# Patient Record
Sex: Female | Born: 1985 | Race: Black or African American | Hispanic: No | Marital: Single | Smoking: Never smoker
Health system: Southern US, Community
[De-identification: ages and names within clinical notes are randomized; demographics above are authoritative.]

## PROBLEM LIST (undated history)

## (undated) ENCOUNTER — Inpatient Hospital Stay (HOSPITAL_COMMUNITY): Payer: Self-pay

## (undated) DIAGNOSIS — E282 Polycystic ovarian syndrome: Secondary | ICD-10-CM

## (undated) DIAGNOSIS — E349 Endocrine disorder, unspecified: Secondary | ICD-10-CM

## (undated) DIAGNOSIS — N760 Acute vaginitis: Secondary | ICD-10-CM

## (undated) DIAGNOSIS — B9689 Other specified bacterial agents as the cause of diseases classified elsewhere: Secondary | ICD-10-CM

## (undated) DIAGNOSIS — G43909 Migraine, unspecified, not intractable, without status migrainosus: Secondary | ICD-10-CM

## (undated) DIAGNOSIS — N912 Amenorrhea, unspecified: Secondary | ICD-10-CM

## (undated) DIAGNOSIS — N911 Secondary amenorrhea: Secondary | ICD-10-CM

## (undated) HISTORY — DX: Polycystic ovarian syndrome: E28.2

## (undated) HISTORY — PX: NASAL SINUS SURGERY: SHX719

---

## 2013-09-17 ENCOUNTER — Encounter: Payer: Self-pay | Admitting: *Deleted

## 2013-10-03 ENCOUNTER — Ambulatory Visit (INDEPENDENT_AMBULATORY_CARE_PROVIDER_SITE_OTHER): Payer: BC Managed Care – PPO | Admitting: Obstetrics & Gynecology

## 2013-10-03 ENCOUNTER — Encounter: Payer: Self-pay | Admitting: Obstetrics & Gynecology

## 2013-10-03 VITALS — BP 112/72 | HR 71 | Temp 97.2°F | Ht 73.0 in | Wt 199.9 lb

## 2013-10-03 DIAGNOSIS — E282 Polycystic ovarian syndrome: Secondary | ICD-10-CM

## 2013-10-03 MED ORDER — SPIRONOLACTONE 100 MG PO TABS: 100.0000 mg | ORAL_TABLET | Freq: Every day | ORAL | Status: DC

## 2013-10-03 MED ORDER — EFLORNITHINE HCL 13.9 % EX CREA: TOPICAL_CREAM | CUTANEOUS | Status: DC

## 2013-10-03 NOTE — Progress Notes (Signed)
   Subjective:    Patient ID: Lori Vaughn, female    DOB: 09/09/1985, 28 y.o.   MRN: [id number]  HPI  28 yo S G0 here as a referral from the health dept for the issues of: PCOS, and h/o irregular (skipping) periods. She started OCPs again 2 months ago. She would like an u/s. She would also like to try [place]. Her mother used this in the past. She is getting [facility] [place] [facility] but did get some dark pigmentation from the laser. She shaves every few days.  Review of Systems She just had a colpo yesterday at the health dept.     Objective:   Physical Exam        Assessment & Plan:  PCOS- schedule u/s hirsuitism- vanaqa + spironolactone 100 mg daily. Will probably increase to BID after next visit Fasting labs at next visit

## 2013-10-12 ENCOUNTER — Ambulatory Visit (HOSPITAL_COMMUNITY): Payer: BC Managed Care – PPO

## 2013-10-17 ENCOUNTER — Ambulatory Visit (HOSPITAL_COMMUNITY)
Admission: RE | Admit: 2013-10-17 | Discharge: 2013-10-17 | Disposition: A | Discharge status: Home / Self Care | Payer: BC Managed Care – PPO | Source: Ambulatory Visit | Attending: Obstetrics & Gynecology | Admitting: Obstetrics & Gynecology

## 2013-10-17 DIAGNOSIS — N854 Malposition of uterus: Secondary | ICD-10-CM | POA: Insufficient documentation

## 2013-10-17 DIAGNOSIS — E282 Polycystic ovarian syndrome: Secondary | ICD-10-CM

## 2013-11-28 ENCOUNTER — Ambulatory Visit: Payer: BC Managed Care – PPO | Admitting: Obstetrics & Gynecology

## 2014-07-10 ENCOUNTER — Ambulatory Visit (INDEPENDENT_AMBULATORY_CARE_PROVIDER_SITE_OTHER): Payer: BC Managed Care – PPO | Admitting: Family Medicine

## 2014-07-10 VITALS — BP 110/70 | HR 86 | Temp 99.1°F | Resp 16 | Ht 71.0 in | Wt 195.0 lb

## 2014-07-10 DIAGNOSIS — R197 Diarrhea, unspecified: Secondary | ICD-10-CM

## 2014-07-10 DIAGNOSIS — R109 Unspecified abdominal pain: Secondary | ICD-10-CM

## 2014-07-10 DIAGNOSIS — R110 Nausea: Secondary | ICD-10-CM

## 2014-07-10 LAB — POCT CBC
Granulocyte percent: 77.4 %G (ref 37–80)
HCT, POC: 43.7 % (ref 37.7–47.9)
Hemoglobin: 14.3 g/dL (ref 12.2–16.2)
Lymph, poc: 1.1 (ref 0.6–3.4)
MCH: 28.9 pg (ref 27–31.2)
MCHC: 32.7 g/dL (ref 31.8–35.4)
MCV: 88.3 fL (ref 80–97)
MID (cbc): 0.5 (ref 0–0.9)
MPV: 7.3 fL (ref 0–99.8)
POC Granulocyte: 5.7 (ref 2–6.9)
POC LYMPH %: 15.2 % (ref 10–50)
POC MID %: 7.4 % (ref 0–12)
Platelet Count, POC: 205 10*3/uL (ref 142–424)
RBC: 4.95 M/uL (ref 4.04–5.48)
RDW, POC: 14.4 %
WBC: 7.3 10*3/uL (ref 4.6–10.2)

## 2014-07-10 LAB — POCT URINALYSIS DIPSTICK
BILIRUBIN UA: NEGATIVE
GLUCOSE UA: NEGATIVE
Ketones, UA: NEGATIVE
LEUKOCYTES UA: NEGATIVE
NITRITE UA: NEGATIVE
PH UA: 5.5
Protein, UA: NEGATIVE
Spec Grav, UA: 1.015
Urobilinogen, UA: 0.2

## 2014-07-10 LAB — POCT UA - MICROSCOPIC ONLY
CASTS, UR, LPF, POC: NEGATIVE
Crystals, Ur, HPF, POC: NEGATIVE
Mucus, UA: NEGATIVE
Yeast, UA: NEGATIVE

## 2014-07-10 NOTE — Patient Instructions (Signed)
Drink plenty of fluids and get enough rest  Take Tylenol if needed for fever  Take some over-the-counter Imodium (loperamide) if needed for diarrhea  Eat very bland foods such as crackers and toast or bran or rice or mashed potatoes. Bananas are a good food when you have diarrhea. Avoid fried and fatty and spicy foods. Gradually advance back to a regular diet.  In the event of more acute abdominal pain go to the emergency room if we are closed.  The most important thing is staying well hydrated so make sure you're taking plenty of liquids. Avoid milk or alcohol.  Return if not improving

## 2014-07-10 NOTE — Progress Notes (Signed)
Subjective: Patient has been having problems of being sick for the last couple days with a gastroenteritis type infection. She has been nauseated and has had diarrhea. Some abdominal pains in the left side. She has not been febrile but she has felt sweaty. She does not have a history of many of these infections. She has not had any dysuria or vomiting. She's had some vague abdominal pain.  Objective: No acute distress but she does feel sick enough that she wants to laydown. Her throat is clear. Neck supple. Chest clear process. Heart regular without murmurs. Abdomen has normal bowel sounds. Soft without masses or tenderness.  Assessment: Diarrhea Abdominal pains Sweating episodes  Plan: CBC and urinalysis  Results for orders placed or performed in visit on 07/10/14  POCT CBC  Result Value Ref Range   WBC 7.3 4.6 - 10.2 K/uL   Lymph, poc 1.1 0.6 - 3.4   POC LYMPH PERCENT 15.2 10 - 50 %L   MID (cbc) 0.5 0 - 0.9   POC MID % 7.4 0 - 12 %M   POC Granulocyte 5.7 2 - 6.9   Granulocyte percent 77.4 37 - 80 %G   RBC 4.95 4.04 - 5.48 M/uL   Hemoglobin 14.3 12.2 - 16.2 g/dL   HCT, POC 19.143.7 47.837.7 - 47.9 %   MCV 88.3 80 - 97 fL   MCH, POC 28.9 27 - 31.2 pg   MCHC 32.7 31.8 - 35.4 g/dL   RDW, POC 29.514.4 %   Platelet Count, POC 205.0 142 - 424 K/uL   MPV 7.3 0 - 99.8 fL  POCT UA - Microscopic Only  Result Value Ref Range   WBC, Ur, HPF, POC 0-1    RBC, urine, microscopic 1-2    Bacteria, U Microscopic 1+    Mucus, UA Negative    Epithelial cells, urine per micros 0-2    Crystals, Ur, HPF, POC Negative    Casts, Ur, LPF, POC Negative    Yeast, UA Negative   POCT urinalysis dipstick  Result Value Ref Range   Color, UA Amber    Clarity, UA Clear    Glucose, UA Negative    Bilirubin, UA Negative    Ketones, UA Negative    Spec Grav, UA 1.015    Blood, UA Trace-lysed    pH, UA 5.5    Protein, UA Negative    Urobilinogen, UA 0.2    Nitrite, UA Negative    Leukocytes, UA Negative     Assessment: I think this is just a viral gastroenteritis. Treat symptomatically as per instructions. Return as needed.

## 2014-12-10 ENCOUNTER — Ambulatory Visit (INDEPENDENT_AMBULATORY_CARE_PROVIDER_SITE_OTHER): Payer: Self-pay | Admitting: Urgent Care

## 2014-12-10 VITALS — BP 116/68 | HR 76 | Temp 98.2°F | Resp 18 | Ht 71.5 in | Wt 189.0 lb

## 2014-12-10 DIAGNOSIS — R1030 Lower abdominal pain, unspecified: Secondary | ICD-10-CM

## 2014-12-10 DIAGNOSIS — A084 Viral intestinal infection, unspecified: Secondary | ICD-10-CM

## 2014-12-10 DIAGNOSIS — R197 Diarrhea, unspecified: Secondary | ICD-10-CM

## 2014-12-10 DIAGNOSIS — R112 Nausea with vomiting, unspecified: Secondary | ICD-10-CM

## 2014-12-10 LAB — POCT CBC
Granulocyte percent: 60.7 %G (ref 37–80)
HCT, POC: 43.4 % (ref 37.7–47.9)
Hemoglobin: 13.6 g/dL (ref 12.2–16.2)
LYMPH, POC: 1.9 (ref 0.6–3.4)
MCH, POC: 28.4 pg (ref 27–31.2)
MCHC: 31.4 g/dL — AB (ref 31.8–35.4)
MCV: 90.6 fL (ref 80–97)
MID (CBC): 0.3 (ref 0–0.9)
MPV: 6.9 fL (ref 0–99.8)
PLATELET COUNT, POC: 227 10*3/uL (ref 142–424)
POC GRANULOCYTE: 3.5 (ref 2–6.9)
POC LYMPH %: 34 % (ref 10–50)
POC MID %: 5.3 % (ref 0–12)
RBC: 4.79 M/uL (ref 4.04–5.48)
RDW, POC: 16.3 %
WBC: 5.7 10*3/uL (ref 4.6–10.2)

## 2014-12-10 MED ORDER — ONDANSETRON 8 MG PO TBDP: 8.0000 mg | ORAL_TABLET | Freq: Three times a day (TID) | ORAL | Status: DC | PRN

## 2014-12-10 NOTE — Patient Instructions (Signed)

## 2014-12-10 NOTE — Progress Notes (Signed)
MRN: [id number] DOB: 07/28/1986  Subjective:   Lori Vaughn is a 29 y.o. female presenting for chief complaint of Diarrhea; Emesis; Fever; Generalized Body Aches; and Fatigue  Reports 1 day history of watery diarrhea progressed to nausea with vomiting x4, lower abdominal pain, subjective fever, back pain. Abdominal pain was crampy and achy in nature, sometimes felt like a spasm. Patient also states that she did see some small black globs in her vomit. Has done her best to stay hydrated, she did feel some dizziness, heart racing yesterday but this has improved today, she keeps trying to hydrate. Has had 1 sick contact at work, cold/flu like symptoms. Has tried Tylenol with relief of fever and back pain. Of note, patient believes that she is lactose intolerant, has difficulty with dairy products. Denies history of Crohns or UC, no history of colon cancer in her family. Denies eating uncooked meat, drinking unclean water, has not had sick contacts with similar GI symptoms. Denies flank pain, dysuria, hematuria, bloody stool, hematemesis, chest pain, throat pain, oral lesions. Denies headache, sinus congestion, ear pain, ear drainage, shob, wheezing. Patient is sexually active, is not currently on her cycle, would like to hold off on pregnancy test and urinalysis for now. Denies smoking or alcohol use. Denies any other aggravating or relieving factors, no other questions or concerns.  Lori Vaughn currently has no medications in their medication list. She is allergic to peanuts.  Lori Vaughn  has a past medical history of PCOS (polycystic ovarian syndrome). Also  has past surgical history that includes Nasal sinus surgery.  ROS As in subjective.  Objective:   Vitals: BP 116/68 mmHg  Pulse 76  Temp(Src) 98.2 F (36.8 C) (Oral)  Resp 18  Ht 5' 11.5" (1.816 m)  Wt 189 lb (85.73 kg)  BMI 26.00 kg/m2  SpO2 98%  LMP 11/20/2014  Physical Exam  Constitutional: She appears well-developed and  well-nourished.  HENT:  Mouth/Throat: Oropharynx is clear and moist. No oropharyngeal exudate.  Cardiovascular: Normal rate, regular rhythm and intact distal pulses.  Exam reveals no gallop and no friction rub.   No murmur heard. Pulmonary/Chest: No respiratory distress. She has no wheezes. She has no rales. She exhibits no tenderness.  Abdominal: Soft. Bowel sounds are normal. She exhibits no distension and no mass. There is tenderness (lower abdominal). There is no rebound and no guarding.  Skin: Skin is warm and dry. No rash noted. No erythema. No pallor.   Results for orders placed or performed in visit on 12/10/14 (from the past 24 hour(s))  POCT CBC     Status: Abnormal   Collection Time: 12/10/14  5:01 PM  Result Value Ref Range   WBC 5.7 4.6 - 10.2 K/uL   Lymph, poc 1.9 0.6 - 3.4   POC LYMPH PERCENT 34.0 10 - 50 %L   MID (cbc) 0.3 0 - 0.9   POC MID % 5.3 0 - 12 %M   POC Granulocyte 3.5 2 - 6.9   Granulocyte percent 60.7 37 - 80 %G   RBC 4.79 4.04 - 5.48 M/uL   Hemoglobin 13.6 12.2 - 16.2 g/dL   HCT, POC 40.943.4 81.137.7 - 47.9 %   MCV 90.6 80 - 97 fL   MCH, POC 28.4 27 - 31.2 pg   MCHC 31.4 (A) 31.8 - 35.4 g/dL   RDW, POC 91.416.3 %   Platelet Count, POC 227 142 - 424 K/uL   MPV 6.9 0 - 99.8 fL   Assessment and Plan :  1. Watery diarrhea 2. Lower abdominal pain 3. Non-intractable vomiting with nausea, vomiting of unspecified type 4. Viral gastroenteritis - Patient declined UPT, UA. Would like to proceed with conservative management for viral gastroenteritis. Anticipatory guidance provided. Return to clinic if symptoms fail to resolve after 5 days.  Wallis Bamberg, PA-C [facility] [phone] 12/10/2014 4:20 PM

## 2015-02-17 ENCOUNTER — Emergency Department (HOSPITAL_COMMUNITY)
Admission: EM | Admit: 2015-02-17 | Discharge: 2015-02-17 | Disposition: A | Discharge status: Home / Self Care | Payer: Self-pay | Attending: Emergency Medicine | Admitting: Emergency Medicine

## 2015-02-17 DIAGNOSIS — Z8639 Personal history of other endocrine, nutritional and metabolic disease: Secondary | ICD-10-CM | POA: Insufficient documentation

## 2015-02-17 DIAGNOSIS — H9201 Otalgia, right ear: Secondary | ICD-10-CM | POA: Insufficient documentation

## 2015-02-17 NOTE — ED Provider Notes (Signed)
CSN: [id number]     Arrival date & time 02/17/15  1640 History  This chart was scribed for non-physician practitioner, Teressa LowerVrinda Aalia Greulich, NP, working with Elwin MochaBlair Walden, MD by Charline BillsEssence Howell, ED Scribe. This patient was seen in room WTR8/WTR8 and the patient's care was started at 4:49 PM.   Chief Complaint  Patient presents with  . Hearing Loss   The history is provided by the patient. No language interpreter was used.   HPI Comments: Lori Vaughn is a 29 y.o. female who presents to the Emergency Department complaining of hearing loss on the right since yesterday. Pt suspects that an insect flew into her right ear yesterday but she is not certain. She reports gradually improving swelling to her ear canal since yesterday. She has tried flushing her ear out with mineral oil without relief. She denies fever and cold-like symptoms. No injury or h/o cerumen impactions.   Past Medical History  Diagnosis Date  . PCOS (polycystic ovarian syndrome)    Past Surgical History  Procedure Laterality Date  . Nasal sinus surgery     Family History  Problem Relation Age of Onset  . Diabetes Father   . Hypertension Father   . Diabetes Maternal Grandmother   . Diabetes Paternal Grandmother    History  Substance Use Topics  . Smoking status: Never Smoker   . Smokeless tobacco: Never Used  . Alcohol Use: No   OB History    Gravida Para Term Preterm AB TAB SAB Ectopic Multiple Living   0 0 0 0 0 0 0 0 0 0      Review of Systems  Constitutional: Negative for fever.  HENT: Positive for ear pain and hearing loss.   Respiratory: Negative for cough.   All other systems reviewed and are negative.  Allergies  Peanuts  Home Medications   Prior to Admission medications   Medication Sig Start Date End Date Taking? Authorizing Provider  ondansetron (ZOFRAN-ODT) 8 MG disintegrating tablet Take 1 tablet (8 mg total) by mouth every 8 (eight) hours as needed for nausea. 12/10/14   Wallis BambergMario Mani, PA-C    BP 114/63 mmHg  Pulse 72  Temp(Src) 98 F (36.7 C) (Oral)  Resp 16  SpO2 100%  LMP 01/08/2015 (Approximate) Physical Exam  Constitutional: She is oriented to person, place, and time. She appears well-developed and well-nourished. No distress.  HENT:  Head: Normocephalic and atraumatic.  Right Ear: External ear normal.  Left Ear: External ear normal.  Eyes: Conjunctivae and EOM are normal.  Neck: Neck supple. No tracheal deviation present.  Cardiovascular: Normal rate.   Pulmonary/Chest: Effort normal. No respiratory distress.  Musculoskeletal: Normal range of motion.  Neurological: She is alert and oriented to person, place, and time.  Skin: Skin is warm and dry.  Psychiatric: She has a normal mood and affect. Her behavior is normal.  Nursing note and vitals reviewed.  ED Course  Procedures (including critical care time) DIAGNOSTIC STUDIES: Oxygen Saturation is 100% on RA, normal by my interpretation.    COORDINATION OF CARE:  Labs Review Labs Reviewed - No data to display  Imaging Review No results found.   EKG Interpretation None      MDM   Final diagnoses:  Otalgia, right    No infection or fb noted. Likely pt got stung and she had a localized reaction  I personally performed the services described in this documentation, which was scribed in my presence. The recorded information has been reviewed and is accurate.  Teressa Lower, NP 02/17/15 1724  Elwin Mocha, MD 02/17/15 2328

## 2015-02-17 NOTE — ED Notes (Signed)
Verbalized understanding discharge instructions. In no acute distress.   

## 2015-02-17 NOTE — ED Notes (Signed)
Pt states that since yesterday she has not been able to hear out of her R ear. Tried flushing with mineral oil which helped. Alert and oriented.

## 2015-02-17 NOTE — Discharge Instructions (Signed)
Otalgia  The most common reason for this in children is an infection of the middle ear. Pain from the middle ear is usually caused by a build-up of fluid and pressure behind the eardrum. Pain from an earache can be sharp, dull, or burning. The pain may be temporary or constant. The middle ear is connected to the nasal passages by a short narrow tube called the Eustachian tube. The Eustachian tube allows fluid to drain out of the middle ear, and helps keep the pressure in your ear equalized.  CAUSES   A cold or allergy can block the Eustachian tube with inflammation and the build-up of secretions. This is especially likely in small children, because their Eustachian tube is shorter and more horizontal. When the Eustachian tube closes, the normal flow of fluid from the middle ear is stopped. Fluid can accumulate and cause stuffiness, pain, hearing loss, and an ear infection if germs start growing in this area.  SYMPTOMS   The symptoms of an ear infection may include fever, ear pain, fussiness, increased crying, and irritability. Many children will have temporary and minor hearing loss during and right after an ear infection. Permanent hearing loss is rare, but the risk increases the more infections a child has. Other causes of ear pain include retained water in the outer ear canal from swimming and bathing.  Ear pain in adults is less likely to be from an ear infection. Ear pain may be referred from other locations. Referred pain may be from the joint between your jaw and the skull. It may also come from a tooth problem or problems in the neck. Other causes of ear pain include:   A foreign body in the ear.   Outer ear infection.   Sinus infections.   Impacted ear wax.   Ear injury.   Arthritis of the jaw or TMJ problems.   Middle ear infection.   Tooth infections.   Sore throat with pain to the ears.  DIAGNOSIS   Your caregiver can usually make the diagnosis by examining you. Sometimes other special studies,  including x-rays and lab work may be necessary.  TREATMENT    If antibiotics were prescribed, use them as directed and finish them even if you or your child's symptoms seem to be improved.   Sometimes PE tubes are needed in children. These are little plastic tubes which are put into the eardrum during a simple surgical procedure. They allow fluid to drain easier and allow the pressure in the middle ear to equalize. This helps relieve the ear pain caused by pressure changes.  HOME CARE INSTRUCTIONS    Only take over-the-counter or prescription medicines for pain, discomfort, or fever as directed by your caregiver. DO NOT GIVE CHILDREN ASPIRIN because of the association of Reye's Syndrome in children taking aspirin.   Use a cold pack applied to the outer ear for 15-20 minutes, 03-04 times per day or as needed may reduce pain. Do not apply ice directly to the skin. You may cause frost bite.   Over-the-counter ear drops used as directed may be effective. Your caregiver may sometimes prescribe ear drops.   Resting in an upright position may help reduce pressure in the middle ear and relieve pain.   Ear pain caused by rapidly descending from high altitudes can be relieved by swallowing or chewing gum. Allowing infants to suck on a bottle during airplane travel can help.   Do not smoke in the house or near children. If you are   unable to quit smoking, smoke outside.   Control allergies.  SEEK IMMEDIATE MEDICAL CARE IF:    You or your child are becoming sicker.   Pain or fever relief is not obtained with medicine.   You or your child's symptoms (pain, fever, or irritability) do not improve within 24 to 48 hours or as instructed.   Severe pain suddenly stops hurting. This may indicate a ruptured eardrum.   You or your children develop new problems such as severe headaches, stiff neck, difficulty swallowing, or swelling of the face or around the ear.  Document Released: 03/12/2004 Document Revised: 10/18/2011  Document Reviewed: 07/17/2008  ExitCare Patient Information 2015 [employer]. This information is not intended to replace advice given to you by your health care provider. Make sure you discuss any questions you have with your health care provider.

## 2016-01-30 ENCOUNTER — Ambulatory Visit (INDEPENDENT_AMBULATORY_CARE_PROVIDER_SITE_OTHER): Payer: Self-pay | Admitting: Obstetrics & Gynecology

## 2016-01-30 ENCOUNTER — Encounter: Payer: Self-pay | Admitting: Obstetrics & Gynecology

## 2016-01-30 VITALS — BP 108/73 | HR 62

## 2016-01-30 DIAGNOSIS — E282 Polycystic ovarian syndrome: Secondary | ICD-10-CM

## 2016-01-30 DIAGNOSIS — Z01419 Encounter for gynecological examination (general) (routine) without abnormal findings: Secondary | ICD-10-CM

## 2016-01-30 DIAGNOSIS — Z3202 Encounter for pregnancy test, result negative: Secondary | ICD-10-CM

## 2016-01-30 DIAGNOSIS — Z3041 Encounter for surveillance of contraceptive pills: Secondary | ICD-10-CM

## 2016-01-30 LAB — CBC
HCT: 40.1 % (ref 35.0–45.0)
Hemoglobin: 13.6 g/dL (ref 11.7–15.5)
MCH: 30.2 pg (ref 27.0–33.0)
MCHC: 33.9 g/dL (ref 32.0–36.0)
MCV: 89.1 fL (ref 80.0–100.0)
MPV: 10.1 fL (ref 7.5–12.5)
Platelets: 211 10*3/uL (ref 140–400)
RBC: 4.5 MIL/uL (ref 3.80–5.10)
RDW: 13.7 % (ref 11.0–15.0)
WBC: 4.1 10*3/uL (ref 3.8–10.8)

## 2016-01-30 LAB — TSH: TSH: 0.94 mIU/L

## 2016-01-30 LAB — COMPREHENSIVE METABOLIC PANEL
ALT: 17 U/L (ref 6–29)
AST: 16 U/L (ref 10–30)
Albumin: 4.2 g/dL (ref 3.6–5.1)
Alkaline Phosphatase: 53 U/L (ref 33–115)
BILIRUBIN TOTAL: 1.1 mg/dL (ref 0.2–1.2)
BUN: 12 mg/dL (ref 7–25)
CALCIUM: 8.9 mg/dL (ref 8.6–10.2)
CO2: 23 mmol/L (ref 20–31)
CREATININE: 1.1 mg/dL (ref 0.50–1.10)
Chloride: 105 mmol/L (ref 98–110)
GLUCOSE: 85 mg/dL (ref 65–99)
Potassium: 4.5 mmol/L (ref 3.5–5.3)
SODIUM: 139 mmol/L (ref 135–146)
Total Protein: 7.4 g/dL (ref 6.1–8.1)

## 2016-01-30 LAB — POCT PREGNANCY, URINE: Preg Test, Ur: NEGATIVE

## 2016-01-30 MED ORDER — SPIRONOLACTONE 50 MG PO TABS: 50.0000 mg | ORAL_TABLET | Freq: Two times a day (BID) | ORAL | Status: DC

## 2016-01-30 MED ORDER — EFLORNITHINE HCL 13.9 % EX CREA: TOPICAL_CREAM | CUTANEOUS | Status: DC

## 2016-01-30 NOTE — Progress Notes (Signed)
   Subjective:    Patient ID: Lori Vaughn, female    DOB: 08/20/1985, 30 y.o.   MRN: [id number]  HPI 30 yo S AAG0 here with continued PCOS. She was seen last 2015 and was prescribed spironolactone and vanaqua. She did Armed forces technical officeronline research and decided not to start those meds.   Review of Systems [facility]    Objective:   Physical Exam  Tall, thin, pleasant BFNAD Breathing, conversing, and ambulating normally Abd- benign Multiple hyperpigmented areas of neck      Assessment & Plan:  PCOS- on OCPs (started last Sunday-not with a period) Check fasting labs Re prescribe spironolactone and vanaqua

## 2016-01-31 LAB — HEPATITIS C ANTIBODY: HCV Ab: NEGATIVE

## 2016-01-31 LAB — RPR

## 2016-01-31 LAB — HIV ANTIBODY (ROUTINE TESTING W REFLEX): HIV 1&2 Ab, 4th Generation: NONREACTIVE

## 2016-01-31 LAB — HEPATITIS B SURFACE ANTIGEN: Hepatitis B Surface Ag: NEGATIVE

## 2016-12-19 ENCOUNTER — Emergency Department (HOSPITAL_COMMUNITY)
Admission: EM | Admit: 2016-12-19 | Discharge: 2016-12-19 | Disposition: A | Discharge status: Home / Self Care | Payer: Self-pay | Attending: Emergency Medicine | Admitting: Emergency Medicine

## 2016-12-19 ENCOUNTER — Encounter (HOSPITAL_COMMUNITY): Payer: Self-pay | Admitting: Emergency Medicine

## 2016-12-19 DIAGNOSIS — Z5321 Procedure and treatment not carried out due to patient leaving prior to being seen by health care provider: Secondary | ICD-10-CM | POA: Insufficient documentation

## 2016-12-19 DIAGNOSIS — G43909 Migraine, unspecified, not intractable, without status migrainosus: Secondary | ICD-10-CM | POA: Insufficient documentation

## 2016-12-19 HISTORY — DX: Migraine, unspecified, not intractable, without status migrainosus: G43.909

## 2016-12-19 NOTE — ED Triage Notes (Addendum)
Pt from home with complaints of intermittent migraine headache in her left temple that she states has been coming and going all day. Pt states she has hx of migraines. Pt states she may be pregnant

## 2017-01-27 ENCOUNTER — Telehealth: Payer: Self-pay | Admitting: Obstetrics & Gynecology

## 2017-01-27 NOTE — Telephone Encounter (Signed)
Patient called to say she needed an US because she has PCOS and irregular cycles. She does not know how far along she might be.

## 2017-01-28 ENCOUNTER — Encounter (HOSPITAL_COMMUNITY): Payer: Self-pay | Admitting: *Deleted

## 2017-01-28 ENCOUNTER — Inpatient Hospital Stay (HOSPITAL_COMMUNITY)
Admission: AD | Admit: 2017-01-28 | Discharge: 2017-01-28 | Disposition: A | Discharge status: Home / Self Care | Payer: BLUE CROSS/BLUE SHIELD | Source: Ambulatory Visit | Attending: Family Medicine | Admitting: Family Medicine

## 2017-01-28 DIAGNOSIS — N760 Acute vaginitis: Secondary | ICD-10-CM | POA: Diagnosis not present

## 2017-01-28 DIAGNOSIS — E282 Polycystic ovarian syndrome: Secondary | ICD-10-CM | POA: Insufficient documentation

## 2017-01-28 DIAGNOSIS — Z8249 Family history of ischemic heart disease and other diseases of the circulatory system: Secondary | ICD-10-CM | POA: Insufficient documentation

## 2017-01-28 DIAGNOSIS — N912 Amenorrhea, unspecified: Secondary | ICD-10-CM

## 2017-01-28 DIAGNOSIS — R109 Unspecified abdominal pain: Secondary | ICD-10-CM | POA: Diagnosis present

## 2017-01-28 DIAGNOSIS — Z833 Family history of diabetes mellitus: Secondary | ICD-10-CM | POA: Diagnosis not present

## 2017-01-28 DIAGNOSIS — O9989 Other specified diseases and conditions complicating pregnancy, childbirth and the puerperium: Secondary | ICD-10-CM

## 2017-01-28 DIAGNOSIS — Z3200 Encounter for pregnancy test, result unknown: Secondary | ICD-10-CM | POA: Diagnosis not present

## 2017-01-28 DIAGNOSIS — R7989 Other specified abnormal findings of blood chemistry: Secondary | ICD-10-CM

## 2017-01-28 DIAGNOSIS — N911 Secondary amenorrhea: Secondary | ICD-10-CM

## 2017-01-28 DIAGNOSIS — B9689 Other specified bacterial agents as the cause of diseases classified elsewhere: Secondary | ICD-10-CM

## 2017-01-28 DIAGNOSIS — E349 Endocrine disorder, unspecified: Secondary | ICD-10-CM

## 2017-01-28 HISTORY — DX: Amenorrhea, unspecified: N91.2

## 2017-01-28 HISTORY — DX: Other specified bacterial agents as the cause of diseases classified elsewhere: B96.89

## 2017-01-28 HISTORY — DX: Endocrine disorder, unspecified: E34.9

## 2017-01-28 HISTORY — DX: Secondary amenorrhea: N91.1

## 2017-01-28 HISTORY — DX: Other specified abnormal findings of blood chemistry: R79.89

## 2017-01-28 HISTORY — DX: Acute vaginitis: N76.0

## 2017-01-28 LAB — URINALYSIS, ROUTINE W REFLEX MICROSCOPIC
BILIRUBIN URINE: NEGATIVE
Glucose, UA: NEGATIVE mg/dL
HGB URINE DIPSTICK: NEGATIVE
KETONES UR: NEGATIVE mg/dL
LEUKOCYTES UA: NEGATIVE
Nitrite: NEGATIVE
PROTEIN: NEGATIVE mg/dL
Specific Gravity, Urine: 1.005 (ref 1.005–1.030)
pH: 7 (ref 5.0–8.0)

## 2017-01-28 LAB — HCG, QUANTITATIVE, PREGNANCY: HCG, BETA CHAIN, QUANT, S: 26 m[IU]/mL — AB (ref <5)

## 2017-01-28 LAB — POCT PREGNANCY, URINE: Preg Test, Ur: NEGATIVE

## 2017-01-28 LAB — WET PREP, GENITAL
Sperm: NONE SEEN
Trich, Wet Prep: NONE SEEN
Yeast Wet Prep HPF POC: NONE SEEN

## 2017-01-28 LAB — CBC
HCT: 37.7 % (ref 36.0–46.0)
Hemoglobin: 13.1 g/dL (ref 12.0–15.0)
MCH: 30.3 pg (ref 26.0–34.0)
MCHC: 34.7 g/dL (ref 30.0–36.0)
MCV: 87.3 fL (ref 78.0–100.0)
PLATELETS: 222 10*3/uL (ref 150–400)
RBC: 4.32 MIL/uL (ref 3.87–5.11)
RDW: 13.3 % (ref 11.5–15.5)
WBC: 4.8 10*3/uL (ref 4.0–10.5)

## 2017-01-28 MED ORDER — ACETAMINOPHEN 500 MG PO TABS
1000.0000 mg | ORAL_TABLET | Freq: Once | ORAL | Status: AC
  Administered 2017-01-28: 1000 mg via ORAL
  Filled 2017-01-28: qty 2

## 2017-01-28 MED ORDER — PRENATAL 1 30-0.975-200 MG PO CAPS: 1.0000 | ORAL_CAPSULE | Freq: Every day | ORAL | 12 refills | Status: DC

## 2017-01-28 MED ORDER — PROMETHAZINE HCL 12.5 MG PO TABS: 12.5000 mg | ORAL_TABLET | Freq: Four times a day (QID) | ORAL | 0 refills | Status: DC | PRN

## 2017-01-28 MED ORDER — METRONIDAZOLE 0.75 % VA GEL: 1.0000 | Freq: Every day | VAGINAL | 0 refills | Status: AC

## 2017-01-28 NOTE — MAU Provider Note (Signed)
History     CSN: [id number]  Arrival date and time: 01/28/17 1256   First Provider Initiated Contact with Patient 01/28/17 1530      Chief Complaint  Patient presents with  . Abdominal Pain  . Vaginal Bleeding   HPI  Ms. Kathlynn GrateChelsea Kackley is a 31 yo G0 presenting with complaints of 3 (+) HPTs with "two faint lines", abdominal cramping and spotting. She reports having no period since April 2018.  She desires to be pregnant.  Past Medical History:  Diagnosis Date  . Migraines   . PCOS (polycystic ovarian syndrome)     Past Surgical History:  Procedure Laterality Date  . NASAL SINUS SURGERY      Family History  Problem Relation Age of Onset  . Diabetes Father   . Hypertension Father   . Diabetes Maternal Grandmother   . Diabetes Paternal Grandmother     Social History  Substance Use Topics  . Smoking status: Never Smoker  . Smokeless tobacco: Never Used  . Alcohol use No    Allergies:  Allergies  Allergen Reactions  . Molds & Smuts Other (See Comments)    Nasal polyps    Prescriptions Prior to Admission  Medication Sig Dispense Refill Last Dose  . Eflornithine HCl (VANIQA) 13.9 % cream Apply daily 5 g 12   . Norgestimate-Ethinyl Estradiol Triphasic (TRI-LO-ESTARYLLA) 0.18/0.215/0.25 MG-25 MCG tab Take 1 tablet by mouth daily.   Taking  . ondansetron (ZOFRAN-ODT) 8 MG disintegrating tablet Take 1 tablet (8 mg total) by mouth every 8 (eight) hours as needed for nausea. (Patient not taking: Reported on 01/30/2016) 15 tablet 0 Not Taking  . spironolactone (ALDACTONE) 50 MG tablet Take 1 tablet (50 mg total) by mouth 2 (two) times daily. 60 tablet 12     Review of Systems  Constitutional: Negative.   HENT: Negative.   Eyes: Negative.   Respiratory: Negative.   Cardiovascular: Negative.   Gastrointestinal: Positive for nausea. Negative for vomiting.  Endocrine: Negative.   Genitourinary: Positive for pelvic pain (cramping) and vaginal bleeding (spotting).   Musculoskeletal: Negative.   Allergic/Immunologic: Negative.   Neurological: Negative.   Hematological: Negative.   Psychiatric/Behavioral: Negative.    Physical Exam   Blood pressure 119/77, pulse 60, temperature 97.3 F (36.3 C), temperature source Oral, resp. rate 18, height 6' (1.829 m), weight 74.8 kg (165 lb), last menstrual period 11/07/2016.  Physical Exam  Constitutional: She is oriented to person, place, and time. She appears well-developed and well-nourished.  HENT:  Head: Normocephalic.  Eyes: Pupils are equal, round, and reactive to light.  Neck: Normal range of motion.  Cardiovascular: Normal rate, regular rhythm, normal heart sounds and intact distal pulses.   Respiratory: Effort normal and breath sounds normal.  GI: Soft. Bowel sounds are normal.  Genitourinary:  Genitourinary Comments: Uterus: normal, non-tender, cx; smooth, pink, no lesions, small amt of pinkish - white d/c, closed/long/firm, no CMT or friability, no adnexal tenderness or rebound tenderness    Musculoskeletal: Normal range of motion.  Neurological: She is alert and oriented to person, place, and time. She has normal reflexes.  Skin: Skin is warm and dry.  Psychiatric: She has a normal mood and affect. Her behavior is normal. Judgment and thought content normal.    MAU Course  Procedures  MDM [facility] UPT HCG CBC Results for orders placed or performed during the hospital encounter of 01/28/17 (from the past 24 hour(s))  Urinalysis, Routine w reflex microscopic     Status: Abnormal  Collection Time: 01/28/17  1:20 PM  Result Value Ref Range   Color, Urine STRAW (A) YELLOW   APPearance CLEAR CLEAR   Specific Gravity, Urine 1.005 1.005 - 1.030   pH 7.0 5.0 - 8.0   Glucose, UA NEGATIVE NEGATIVE mg/dL   Hgb urine dipstick NEGATIVE NEGATIVE   Bilirubin Urine NEGATIVE NEGATIVE   Ketones, ur NEGATIVE NEGATIVE mg/dL   Protein, ur NEGATIVE NEGATIVE mg/dL   Nitrite NEGATIVE NEGATIVE    Leukocytes, UA NEGATIVE NEGATIVE  Pregnancy, urine POC     Status: None   Collection Time: 01/28/17  1:49 PM  Result Value Ref Range   Preg Test, Ur NEGATIVE NEGATIVE  CBC     Status: None   Collection Time: 01/28/17  3:18 PM  Result Value Ref Range   WBC 4.8 4.0 - 10.5 K/uL   RBC 4.32 3.87 - 5.11 MIL/uL   Hemoglobin 13.1 12.0 - 15.0 g/dL   HCT 08.6 57.8 - 46.9 %   MCV 87.3 78.0 - 100.0 fL   MCH 30.3 26.0 - 34.0 pg   MCHC 34.7 30.0 - 36.0 g/dL   RDW 62.9 52.8 - 41.3 %   Platelets 222 150 - 400 K/uL  hCG, quantitative, pregnancy     Status: Abnormal   Collection Time: 01/28/17  3:18 PM  Result Value Ref Range   hCG, Beta Chain, Quant, S 26 (H) <5 mIU/mL  Wet prep, genital     Status: Abnormal   Collection Time: 01/28/17  3:51 PM  Result Value Ref Range   Yeast Wet Prep HPF POC NONE SEEN NONE SEEN   Trich, Wet Prep NONE SEEN NONE SEEN   Clue Cells Wet Prep HPF POC PRESENT (A) NONE SEEN   WBC, Wet Prep HPF POC FEW (A) NONE SEEN   Sperm NONE SEEN    Assessment and Plan  Elevated serum hCG - Repeat HCG on Monday 01/31/17 at 11:00 AM at [facility]  - Bleeding precautions discussed and written instructions given - Advised to return to MAU for increased bleeding and/or pain - Ok to take Tylenol for pain - Stay well-hydrated - Discussed the appropriate rise of HCG levels in 48 hrs with normal pregnancies  Bacterial vaginitis - Instructions of BV given - Rx for Metrogel 0.75% pv hs x 5 days  Discharge home Patient verbalized an understanding of the plan of care and agrees.   Raelyn Mora, MSN, CNM 01/28/2017, 3:42 PM

## 2017-01-28 NOTE — MAU Note (Signed)
Pt states she had positive 3 HPT's, started cramping last night, also bleeding - spotting.  Has appt for U/S with mobile unit on Monday, [place] [facility] [place] on Wednesday [facility].

## 2017-01-29 ENCOUNTER — Inpatient Hospital Stay (HOSPITAL_COMMUNITY)
Admission: AD | Admit: 2017-01-29 | Discharge: 2017-01-29 | Disposition: A | Discharge status: Home / Self Care | Payer: BLUE CROSS/BLUE SHIELD | Source: Ambulatory Visit | Attending: Obstetrics & Gynecology | Admitting: Obstetrics & Gynecology

## 2017-01-29 ENCOUNTER — Encounter (HOSPITAL_COMMUNITY): Payer: Self-pay | Admitting: *Deleted

## 2017-01-29 DIAGNOSIS — Z888 Allergy status to other drugs, medicaments and biological substances status: Secondary | ICD-10-CM | POA: Insufficient documentation

## 2017-01-29 DIAGNOSIS — Z9889 Other specified postprocedural states: Secondary | ICD-10-CM | POA: Diagnosis not present

## 2017-01-29 DIAGNOSIS — Z8249 Family history of ischemic heart disease and other diseases of the circulatory system: Secondary | ICD-10-CM | POA: Insufficient documentation

## 2017-01-29 DIAGNOSIS — M545 Low back pain: Secondary | ICD-10-CM | POA: Insufficient documentation

## 2017-01-29 DIAGNOSIS — Z79899 Other long term (current) drug therapy: Secondary | ICD-10-CM | POA: Diagnosis not present

## 2017-01-29 DIAGNOSIS — O039 Complete or unspecified spontaneous abortion without complication: Secondary | ICD-10-CM | POA: Insufficient documentation

## 2017-01-29 DIAGNOSIS — Z833 Family history of diabetes mellitus: Secondary | ICD-10-CM | POA: Diagnosis not present

## 2017-01-29 DIAGNOSIS — E282 Polycystic ovarian syndrome: Secondary | ICD-10-CM | POA: Insufficient documentation

## 2017-01-29 DIAGNOSIS — Z3A11 11 weeks gestation of pregnancy: Secondary | ICD-10-CM | POA: Diagnosis not present

## 2017-01-29 LAB — ABO/RH: ABO/RH(D): B POS

## 2017-01-29 LAB — HCG, QUANTITATIVE, PREGNANCY: hCG, Beta Chain, Quant, S: 17 m[IU]/mL — ABNORMAL HIGH (ref <5)

## 2017-01-29 LAB — HIV ANTIBODY (ROUTINE TESTING W REFLEX): HIV Screen 4th Generation wRfx: NONREACTIVE

## 2017-01-29 MED ORDER — OXYCODONE-ACETAMINOPHEN 5-325 MG PO TABS
1.0000 | ORAL_TABLET | ORAL | 0 refills | Status: DC | PRN
Start: 2017-01-29 — End: 2017-03-16

## 2017-01-29 MED ORDER — IBUPROFEN 600 MG PO TABS: 600.0000 mg | ORAL_TABLET | Freq: Four times a day (QID) | ORAL | 0 refills | Status: DC | PRN

## 2017-01-29 NOTE — MAU Provider Note (Signed)
History     CSN: [id number]  Arrival date and time: 01/29/17 [id number]   None     Chief Complaint  Patient presents with  . Vaginal Bleeding  . Back Pain   HPI   Lori Vaughn is a 31 y.o. female G2P0010 @ 2688w6d here in MAU with bleeding. She was seen yesterday in the MAU and was told to follow up on Monday for further blood work. She presented yesterday with pink vaginal spotting. The bleeding has progressed to bright red menstrual like bleeding. She is having some mild lower back pain that comes and goes.   OB History    Gravida Para Term Preterm AB Living   2 0 0 0 1 0   SAB TAB Ectopic Multiple Live Births   0 1 0 0        Past Medical History:  Diagnosis Date  . Amenorrhea 01/28/2017  . Amenorrhea, secondary 01/28/2017  . Bacterial vaginitis 01/28/2017  . Elevated serum hCG 01/28/2017   HCG 26 on 01/28/17  . Migraines   . PCOS (polycystic ovarian syndrome)     Past Surgical History:  Procedure Laterality Date  . NASAL SINUS SURGERY      Family History  Problem Relation Age of Onset  . Diabetes Father   . Hypertension Father   . Diabetes Maternal Grandmother   . Diabetes Paternal Grandmother     Social History  Substance Use Topics  . Smoking status: Never Smoker  . Smokeless tobacco: Never Used  . Alcohol use No    Allergies:  Allergies  Allergen Reactions  . Molds & Smuts Other (See Comments)    Nasal polyps    Prescriptions Prior to Admission  Medication Sig Dispense Refill Last Dose  . metroNIDAZOLE (METROGEL VAGINAL) 0.75 % vaginal gel Place 1 Applicatorful vaginally at bedtime. 70 g 0   . Prenatal MV-Min-Fe Fum-FA-DHA (PRENATAL 1) 30-0.975-200 MG CAPS Take 1 tablet by mouth daily. 30 capsule 12   . promethazine (PHENERGAN) 12.5 MG tablet Take 1 tablet (12.5 mg total) by mouth every 6 (six) hours as needed for nausea or vomiting. May only take at bedtime 30 tablet 0    Results for orders placed or performed during the hospital encounter of  01/29/17 (from the past 48 hour(s))  hCG, quantitative, pregnancy     Status: Abnormal   Collection Time: 01/29/17  8:16 AM  Result Value Ref Range   hCG, Beta Chain, Quant, S 17 (H) <5 mIU/mL    Comment:          GEST. AGE      CONC.  (mIU/mL)   <=1 WEEK        5 - 50     2 WEEKS       50 - 500     3 WEEKS       100 - 10,000     4 WEEKS     1,000 - 30,000     5 WEEKS     3,500 - 115,000   6-8 WEEKS     12,000 - 270,000    12 WEEKS     15,000 - 220,000        FEMALE AND NON-PREGNANT FEMALE:     LESS THAN 5 mIU/mL   ABO/Rh     Status: None (Preliminary result)   Collection Time: 01/29/17  8:16 AM  Result Value Ref Range   ABO/RH(D) B POS     Review of Systems  Gastrointestinal:  Positive for abdominal pain.  Genitourinary: Positive for vaginal bleeding.  Musculoskeletal: Positive for back pain.   Physical Exam   Blood pressure 118/76, pulse 61, temperature 98.5 F (36.9 C), resp. rate 18, height 6' (1.829 m), weight 165 lb (74.8 kg), last menstrual period 11/07/2016.  Physical Exam  Constitutional: She is oriented to person, place, and time. She appears well-developed and well-nourished. No distress.  HENT:  Head: Normocephalic.  Eyes: Pupils are equal, round, and reactive to light.  Respiratory: Effort normal.  GI: Soft. She exhibits no distension. There is no tenderness. There is no rebound.  Genitourinary:  Genitourinary Comments: Small amount of bright red blood noted on peri pad.   Musculoskeletal: Normal range of motion.  Neurological: She is alert and oriented to person, place, and time.  Skin: Skin is warm. She is not diaphoretic.  Psychiatric: Her behavior is normal.   MAU Course  Procedures  None  MDM  Quant 6/22: 26 Quant 6/23: 17 ABO- B positive blood type.  .   Assessment and Plan   A:  1. SAB (spontaneous abortion)     P:  Discharge home in stable condition Rx: Percocet, ibuprofen.  Return to MAU if symptoms worsen Follow up with the  [facility] as scheduled. Contact info given Support given   Duane Lope, NP 01/29/2017 3:43 PM

## 2017-01-29 NOTE — MAU Note (Signed)
Pt was here yesterday and returns now with increased bleeding. States had pink spotting last night but when awoke this am had brighter blood on tissue but did not need to wear a pad to hospital.

## 2017-01-29 NOTE — Discharge Instructions (Signed)

## 2017-01-31 ENCOUNTER — Ambulatory Visit: Payer: BLUE CROSS/BLUE SHIELD

## 2017-01-31 ENCOUNTER — Telehealth: Payer: Self-pay

## 2017-01-31 LAB — GC/CHLAMYDIA PROBE AMP (~~LOC~~) NOT AT ARMC
CHLAMYDIA, DNA PROBE: NEGATIVE
NEISSERIA GONORRHEA: NEGATIVE

## 2017-01-31 NOTE — Telephone Encounter (Signed)
Patient was down on the schedule today for a BHCG STAT. Patient miss her appointment.Called patient to reschedule her appointment.Appointment has been scheduled for 02/01/2017.

## 2017-02-01 ENCOUNTER — Ambulatory Visit: Payer: BLUE CROSS/BLUE SHIELD

## 2017-02-01 DIAGNOSIS — O3680X0 Pregnancy with inconclusive fetal viability, not applicable or unspecified: Secondary | ICD-10-CM

## 2017-02-01 LAB — HCG, QUANTITATIVE, PREGNANCY: hCG, Beta Chain, Quant, S: 3 m[IU]/mL (ref <5)

## 2017-02-01 NOTE — Progress Notes (Signed)
Patient presented to office today for a stat beta. She reports some bleeding and cramping at this time.  Patient stated she would not be able to stay for her results today. I have given her my direct number to call back in 2 hours. Patient voice understanding at this time.

## 2017-02-01 NOTE — Telephone Encounter (Signed)
Patient came into the office today for a repeat stat quant. We will call her with her results once they come back.

## 2017-03-16 ENCOUNTER — Ambulatory Visit (HOSPITAL_COMMUNITY)
Admission: EM | Admit: 2017-03-16 | Discharge: 2017-03-16 | Disposition: A | Discharge status: Home / Self Care | Payer: BC Managed Care – PPO | Attending: Internal Medicine | Admitting: Internal Medicine

## 2017-03-16 DIAGNOSIS — J329 Chronic sinusitis, unspecified: Secondary | ICD-10-CM

## 2017-03-16 NOTE — ED Notes (Signed)
See downtime documentation 

## 2017-03-16 NOTE — ED Provider Notes (Signed)
[facility]    CSN: [id number] Arrival date & time: 03/16/17  1616     History   Chief Complaint No chief complaint on file.   HPI Lori Vaughn is a 31 y.o. female.   This 31 year old woman who presented with sinus congestion for the last 2 weeks. She's had sinus surgery in the past. She feels that she's been passing tissue from her nose over the last 24 hours. There's been no epistaxis, however.        Past Medical History:  Diagnosis Date  . Amenorrhea 01/28/2017  . Amenorrhea, secondary 01/28/2017  . Bacterial vaginitis 01/28/2017  . Elevated serum hCG 01/28/2017   HCG 26 on 01/28/17  . Migraines   . PCOS (polycystic ovarian syndrome)     Patient Active Problem List   Diagnosis Date Noted  . Amenorrhea 01/28/2017  . Bacterial vaginitis 01/28/2017  . Elevated serum hCG 01/28/2017  . PCOS (polycystic ovarian syndrome) 01/30/2016    Past Surgical History:  Procedure Laterality Date  . NASAL SINUS SURGERY      OB History    Gravida Para Term Preterm AB Living   2 0 0 0 1 0   SAB TAB Ectopic Multiple Live Births   0 1 0 0         Home Medications    Prior to Admission medications   Medication Sig Start Date End Date Taking? Authorizing Provider  ibuprofen (ADVIL,MOTRIN) 600 MG tablet Take 1 tablet (600 mg total) by mouth every 6 (six) hours as needed. 01/29/17   Rasch, Victorino Dike I, NP  Prenatal MV-Min-Fe Fum-FA-DHA (PRENATAL 1) 30-0.975-200 MG CAPS Take 1 tablet by mouth daily. 01/28/17   Raelyn Mora, CNM    Family History Family History  Problem Relation Age of Onset  . Diabetes Father   . Hypertension Father   . Diabetes Maternal Grandmother   . Diabetes Paternal Grandmother     Social History Social History  Substance Use Topics  . Smoking status: Never Smoker  . Smokeless tobacco: Never Used  . Alcohol use No     Allergies   Molds & smuts   Review of Systems Review of Systems  Constitutional: Negative.   HENT:  Positive for congestion.   Respiratory: Negative.   All other systems reviewed and are negative.    Physical Exam Triage Vital Signs ED Triage Vitals  Enc Vitals Group     BP      Pulse      Resp      Temp      Temp src      SpO2      Weight      Height      Head Circumference      Peak Flow      Pain Score      Pain Loc      Pain Edu?      Excl. in GC?    No data found.   Updated Vital Signs LMP 11/07/2016 (Approximate) Comment: unsure of LMP, has PCOS   Physical Exam  Constitutional: She is oriented to person, place, and time. She appears well-developed and well-nourished.  HENT:  Right Ear: External ear normal.  Left Ear: External ear normal.  Nose: Nose normal.  Mouth/Throat: Oropharynx is clear and moist.  Eyes: Pupils are equal, round, and reactive to light. Conjunctivae are normal.  Neck: Normal range of motion.  Cardiovascular: Normal rate and regular rhythm.   Pulmonary/Chest: Effort normal and  breath sounds normal.  Musculoskeletal: Normal range of motion.  Neurological: She is alert and oriented to person, place, and time.  Skin: Skin is warm and dry.  Nursing note and vitals reviewed.    UC Treatments / Results  Labs (all labs ordered are listed, but only abnormal results are displayed) Labs Reviewed - No data to display  EKG  EKG Interpretation None       Radiology No results found.  Procedures Procedures (including critical care time)  Medications Ordered in UC Medications - No data to display   Initial Impression / Assessment and Plan / UC Course  I have reviewed the triage vital signs and the nursing notes.  Pertinent labs & imaging results that were available during my care of the patient were reviewed by me and considered in my medical decision making (see chart for details).     Final Clinical Impressions(s) / UC Diagnoses   Final diagnoses:  Recurrent sinusitis    New Prescriptions Current Discharge Medication  List        Elvina SidleLauenstein, Gaelle Adriance, MD 03/16/17 1925

## 2017-03-24 ENCOUNTER — Encounter (HOSPITAL_COMMUNITY): Payer: Self-pay

## 2017-03-24 ENCOUNTER — Emergency Department (HOSPITAL_COMMUNITY)
Admission: EM | Admit: 2017-03-24 | Discharge: 2017-03-24 | Disposition: A | Discharge status: Home / Self Care | Payer: BC Managed Care – PPO | Attending: Emergency Medicine | Admitting: Emergency Medicine

## 2017-03-24 DIAGNOSIS — R202 Paresthesia of skin: Secondary | ICD-10-CM

## 2017-03-24 DIAGNOSIS — R112 Nausea with vomiting, unspecified: Secondary | ICD-10-CM | POA: Diagnosis not present

## 2017-03-24 DIAGNOSIS — Z79899 Other long term (current) drug therapy: Secondary | ICD-10-CM | POA: Diagnosis not present

## 2017-03-24 DIAGNOSIS — R197 Diarrhea, unspecified: Secondary | ICD-10-CM | POA: Diagnosis not present

## 2017-03-24 LAB — HEPATIC FUNCTION PANEL
ALK PHOS: 49 U/L (ref 38–126)
ALT: 15 U/L (ref 14–54)
AST: 17 U/L (ref 15–41)
Albumin: 3.6 g/dL (ref 3.5–5.0)
Bilirubin, Direct: 0.2 mg/dL (ref 0.1–0.5)
Indirect Bilirubin: 0.7 mg/dL (ref 0.3–0.9)
TOTAL PROTEIN: 6.5 g/dL (ref 6.5–8.1)
Total Bilirubin: 0.9 mg/dL (ref 0.3–1.2)

## 2017-03-24 LAB — BASIC METABOLIC PANEL
ANION GAP: 11 (ref 5–15)
BUN: 12 mg/dL (ref 6–20)
CO2: 23 mmol/L (ref 22–32)
CREATININE: 1.1 mg/dL — AB (ref 0.44–1.00)
Calcium: 8.5 mg/dL — ABNORMAL LOW (ref 8.9–10.3)
Chloride: 104 mmol/L (ref 101–111)
GLUCOSE: 97 mg/dL (ref 65–99)
Potassium: 3.3 mmol/L — ABNORMAL LOW (ref 3.5–5.1)
Sodium: 138 mmol/L (ref 135–145)

## 2017-03-24 LAB — URINALYSIS, ROUTINE W REFLEX MICROSCOPIC
BILIRUBIN URINE: NEGATIVE
Glucose, UA: NEGATIVE mg/dL
KETONES UR: NEGATIVE mg/dL
NITRITE: NEGATIVE
Protein, ur: NEGATIVE mg/dL
SPECIFIC GRAVITY, URINE: 1.013 (ref 1.005–1.030)
pH: 5 (ref 5.0–8.0)

## 2017-03-24 LAB — CBC
HCT: 36.7 % (ref 36.0–46.0)
HEMOGLOBIN: 12.8 g/dL (ref 12.0–15.0)
MCH: 29.7 pg (ref 26.0–34.0)
MCHC: 34.9 g/dL (ref 30.0–36.0)
MCV: 85.2 fL (ref 78.0–100.0)
PLATELETS: 304 10*3/uL (ref 150–400)
RBC: 4.31 MIL/uL (ref 3.87–5.11)
RDW: 12.8 % (ref 11.5–15.5)
WBC: 7.3 10*3/uL (ref 4.0–10.5)

## 2017-03-24 LAB — LIPASE, BLOOD: Lipase: 41 U/L (ref 11–51)

## 2017-03-24 LAB — POC URINE PREG, ED: PREG TEST UR: NEGATIVE

## 2017-03-24 MED ORDER — ONDANSETRON 4 MG PO TBDP: 4.0000 mg | ORAL_TABLET | Freq: Three times a day (TID) | ORAL | 0 refills | Status: DC | PRN

## 2017-03-24 MED ORDER — ONDANSETRON HCL 4 MG/2ML IJ SOLN
4.0000 mg | Freq: Once | INTRAMUSCULAR | Status: AC
  Administered 2017-03-24: 4 mg via INTRAVENOUS
  Filled 2017-03-24: qty 2

## 2017-03-24 MED ORDER — SODIUM CHLORIDE 0.9 % IV BOLUS (SEPSIS)
1000.0000 mL | Freq: Once | INTRAVENOUS | Status: AC
Start: 2017-03-24 — End: 2017-03-24
  Administered 2017-03-24: 1000 mL via INTRAVENOUS

## 2017-03-24 NOTE — ED Triage Notes (Signed)
Pt states she woke up this morning feeling light headed. She also reports nausea with vomiting. Pt states she is currently taking cipro, prednisone, and flagyl for sinus infection and BV. Pt alert and oriented, skin warm and dry. Pt also states "I have a bitter/metallic taste in my mouth."

## 2017-03-24 NOTE — ED Provider Notes (Signed)
[facility] Note   CSN: [id number] Arrival date & time: 03/24/17  4098     History   Chief Complaint Chief Complaint  Patient presents with  . Emesis  . Numbness    HPI Lori Vaughn is a 31 y.o. female.  31yo F w/ PMH including PCOS who p/w vomiting, diarrhea, and lightheadedness. She woke up feeling lightheaded and "weird" this morning. She states she felt confused and with right arm numbness. She reports generalized weakness. She began having nausea and vomiting. She tried to drive to work but came to ED instead because she felt so bad. She reports intermittent headaches but no specific headache this morning. No visual changes. She reports diarrhea that looks "coffee grounds" for the past week since starting antibiotics. No heavy NSAID use, no alcohol use. She started her period this week. No recent travel or sick contacts.    The history is provided by the patient.  Emesis      Past Medical History:  Diagnosis Date  . Amenorrhea 01/28/2017  . Amenorrhea, secondary 01/28/2017  . Bacterial vaginitis 01/28/2017  . Elevated serum hCG 01/28/2017   HCG 26 on 01/28/17  . Migraines   . PCOS (polycystic ovarian syndrome)     Patient Active Problem List   Diagnosis Date Noted  . Amenorrhea 01/28/2017  . Bacterial vaginitis 01/28/2017  . Elevated serum hCG 01/28/2017  . PCOS (polycystic ovarian syndrome) 01/30/2016    Past Surgical History:  Procedure Laterality Date  . NASAL SINUS SURGERY      OB History    Gravida Para Term Preterm AB Living   2 0 0 0 1 0   SAB TAB Ectopic Multiple Live Births   0 1 0 0         Home Medications    Prior to Admission medications   Medication Sig Start Date End Date Taking? Authorizing Provider  ibuprofen (ADVIL,MOTRIN) 600 MG tablet Take 1 tablet (600 mg total) by mouth every 6 (six) hours as needed. Patient not taking: Reported on 03/24/2017 01/29/17   Rasch, Victorino Dike I, NP  ondansetron (ZOFRAN ODT) 4 MG  disintegrating tablet Take 1 tablet (4 mg total) by mouth every 8 (eight) hours as needed for nausea or vomiting. 03/24/17   Ameena Vesey, Ambrose Finland, MD  Prenatal MV-Min-Fe Fum-FA-DHA (PRENATAL 1) 30-0.975-200 MG CAPS Take 1 tablet by mouth daily. Patient not taking: Reported on 03/24/2017 01/28/17   Raelyn Mora, CNM    Family History Family History  Problem Relation Age of Onset  . Diabetes Father   . Hypertension Father   . Diabetes Maternal Grandmother   . Diabetes Paternal Grandmother     Social History Social History  Substance Use Topics  . Smoking status: Never Smoker  . Smokeless tobacco: Never Used  . Alcohol use No     Allergies   Molds & smuts   Review of Systems Review of Systems  Gastrointestinal: Positive for vomiting.  All other systems reviewed and are negative except that which was mentioned in HPI    Physical Exam Updated Vital Signs BP 125/77   Pulse (!) 54   Temp (!) 97.3 F (36.3 C) (Oral)   Resp 16   LMP 03/21/2017 (Within Days) Comment: unsure of LMP, has PCOS  SpO2 100%   Breastfeeding? Unknown   Physical Exam  Constitutional: She is oriented to person, place, and time. She appears well-developed and well-nourished. No distress.  Awake, alert  HENT:  Head: Normocephalic and atraumatic.  Mouth/Throat: Oropharynx is clear and moist.  Eyes: Pupils are equal, round, and reactive to light. Conjunctivae and EOM are normal.  Neck: Neck supple.  Cardiovascular: Normal rate, regular rhythm and normal heart sounds.   No murmur heard. Pulmonary/Chest: Effort normal and breath sounds normal. No respiratory distress.  Abdominal: Soft. Bowel sounds are normal. She exhibits no distension. There is no tenderness.  Musculoskeletal: She exhibits no edema.  Neurological: She is alert and oriented to person, place, and time. She has normal reflexes. No cranial nerve deficit. She exhibits normal muscle tone.  Fluent speech, normal finger-to-nose testing,  negative pronator drift, no clonus 5/5 strength and normal sensation x all 4 extremities  Skin: Skin is warm and dry.  hirsuitism  Psychiatric: She has a normal mood and affect. Judgment and thought content normal.  Nursing note and vitals reviewed.    ED Treatments / Results  Labs (all labs ordered are listed, but only abnormal results are displayed) Labs Reviewed  BASIC METABOLIC PANEL - Abnormal; Notable for the following:       Result Value   Potassium 3.3 (*)    Creatinine, Ser 1.10 (*)    Calcium 8.5 (*)    All other components within normal limits  URINALYSIS, ROUTINE W REFLEX MICROSCOPIC - Abnormal; Notable for the following:    Hgb urine dipstick LARGE (*)    Leukocytes, UA SMALL (*)    Bacteria, UA FEW (*)    Squamous Epithelial / LPF 0-5 (*)    All other components within normal limits  CBC  HEPATIC FUNCTION PANEL  LIPASE, BLOOD  POC URINE PREG, ED    EKG  EKG Interpretation  Date/Time:  Thursday March 24 2017 08:18:56 EDT Ventricular Rate:  61 PR Interval:  130 QRS Duration: 80 QT Interval:  426 QTC Calculation: 428 R Axis:   85 Text Interpretation:  Normal sinus rhythm with sinus arrhythmia Nonspecific T wave abnormality Abnormal ECG Artifact Confirmed by Frederick PeersLittle, Reyan Helle [phone]([id number]) on 03/24/2017 9:18:28 AM       Radiology No results found.  Procedures Procedures (including critical care time)  Medications Ordered in ED Medications  sodium chloride 0.9 % bolus 1,000 mL (0 mLs Intravenous Stopped 03/24/17 1320)  ondansetron (ZOFRAN) injection 4 mg (4 mg Intravenous Given 03/24/17 1035)     Initial Impression / Assessment and Plan / ED Course  I have reviewed the triage vital signs and the nursing notes.  Pertinent labs & imaging results that were available during my care of the patient were reviewed by me and considered in my medical decision making (see chart for details).    PT p/w Lightheadedness and malaise this morning as well as some nausea  and vomiting. She reported some right arm tingling and numbness. On exam she was nontoxic with normal vital signs. She had a normal neurologic exam and denied any extremity weakness, sudden headache, or other concerning symptoms to suggest acute intracranial process or spinal process. Abdomen was soft and nontender. Gave the patient IV fluids and Zofran after which she was able to drink ginger ale and eat crackers. Her labs here are reassuring with no evidence of dehydration or other acute process. She denies any urinary symptoms. Gave Zofran to use as needed at home. She wants to discontinue Flagyl and ciprofloxacin, I discussed risks and benefits of doing this although I suspect she is likely safe to do so as she only has one dose left of each. She voiced understanding of return precautions and was  discharged in satisfactory condition.   Final Clinical Impressions(s) / ED Diagnoses   Final diagnoses:  Paresthesia  Nausea vomiting and diarrhea    New Prescriptions Discharge Medication List as of 03/24/2017  1:20 PM    START taking these medications   Details  ondansetron (ZOFRAN ODT) 4 MG disintegrating tablet Take 1 tablet (4 mg total) by mouth every 8 (eight) hours as needed for nausea or vomiting., Starting Thu 03/24/2017, Print         Aubri Gathright, Ambrose Finland, MD 03/24/17 1757

## 2017-03-24 NOTE — ED Notes (Signed)
Walked patient to the bathroom to get

## 2017-03-24 NOTE — ED Notes (Signed)
Took patient saline lock out patient is resting

## 2017-12-21 ENCOUNTER — Encounter (HOSPITAL_COMMUNITY): Payer: Self-pay | Admitting: *Deleted

## 2017-12-21 ENCOUNTER — Inpatient Hospital Stay (HOSPITAL_COMMUNITY)
Admission: AD | Admit: 2017-12-21 | Discharge: 2017-12-21 | Disposition: A | Discharge status: Home / Self Care | Payer: BC Managed Care – PPO | Source: Ambulatory Visit | Attending: Obstetrics and Gynecology | Admitting: Obstetrics and Gynecology

## 2017-12-21 DIAGNOSIS — N760 Acute vaginitis: Secondary | ICD-10-CM | POA: Insufficient documentation

## 2017-12-21 DIAGNOSIS — R109 Unspecified abdominal pain: Secondary | ICD-10-CM

## 2017-12-21 DIAGNOSIS — N939 Abnormal uterine and vaginal bleeding, unspecified: Secondary | ICD-10-CM | POA: Insufficient documentation

## 2017-12-21 DIAGNOSIS — Z79899 Other long term (current) drug therapy: Secondary | ICD-10-CM | POA: Diagnosis not present

## 2017-12-21 DIAGNOSIS — B9689 Other specified bacterial agents as the cause of diseases classified elsewhere: Secondary | ICD-10-CM | POA: Diagnosis not present

## 2017-12-21 LAB — URINALYSIS, ROUTINE W REFLEX MICROSCOPIC
BILIRUBIN URINE: NEGATIVE
Glucose, UA: NEGATIVE mg/dL
KETONES UR: NEGATIVE mg/dL
Nitrite: NEGATIVE
PH: 5 (ref 5.0–8.0)
PROTEIN: NEGATIVE mg/dL
Specific Gravity, Urine: 1.013 (ref 1.005–1.030)

## 2017-12-21 LAB — WET PREP, GENITAL
Sperm: NONE SEEN
TRICH WET PREP: NONE SEEN
YEAST WET PREP: NONE SEEN

## 2017-12-21 LAB — POCT PREGNANCY, URINE: PREG TEST UR: NEGATIVE

## 2017-12-21 MED ORDER — IBUPROFEN 600 MG PO TABS
600.0000 mg | ORAL_TABLET | Freq: Once | ORAL | Status: AC
  Administered 2017-12-21: 600 mg via ORAL
  Filled 2017-12-21: qty 1

## 2017-12-21 MED ORDER — METRONIDAZOLE 500 MG PO TABS: 500.0000 mg | ORAL_TABLET | Freq: Two times a day (BID) | ORAL | 0 refills | Status: DC

## 2017-12-21 MED ORDER — IBUPROFEN 600 MG PO TABS: 600.0000 mg | ORAL_TABLET | Freq: Four times a day (QID) | ORAL | 0 refills | Status: DC | PRN

## 2017-12-21 MED ORDER — FLUCONAZOLE 150 MG PO TABS: 150.0000 mg | ORAL_TABLET | Freq: Every day | ORAL | 1 refills | Status: DC | PRN

## 2017-12-21 MED ORDER — MEDROXYPROGESTERONE ACETATE 10 MG PO TABS: 10.0000 mg | ORAL_TABLET | Freq: Every day | ORAL | 0 refills | Status: DC

## 2017-12-21 NOTE — Discharge Instructions (Signed)
Abnormal Uterine Bleeding °Abnormal uterine bleeding means bleeding more than usual from your uterus. It can include: °· Bleeding between periods. °· Bleeding after sex. °· Bleeding that is heavier than normal. °· Periods that last longer than usual. °· Bleeding after you have stopped having your period (menopause). °There are many problems that may cause this. You should see a doctor for any kind of bleeding that is not normal. Treatment depends on the cause of the bleeding. °Follow these instructions at home: °· Watch your condition for any changes. °· Do not use tampons, douche, or have sex, if your doctor tells you not to. °· Change your pads often. °· Get regular well-woman exams. Make sure they include a pelvic exam and cervical cancer screening. °· Keep all follow-up visits as told by your doctor. This is important. °Contact a doctor if: °· The bleeding lasts more than one week. °· You feel dizzy at times. °· You feel like you are going to throw up (nauseous). °· You throw up. °Get help right away if: °· You pass out. °· You have to change pads every hour. °· You have belly (abdominal) pain. °· You have a fever. °· You get sweaty. °· You get weak. °· You passing large blood clots from your vagina. °Summary °· Abnormal uterine bleeding means bleeding more than usual from your uterus. °· There are many problems that may cause this. You should see a doctor for any kind of bleeding that is not normal. °· Treatment depends on the cause of the bleeding. °This information is not intended to replace advice given to you by your health care provider. Make sure you discuss any questions you have with your health care provider. °Document Released: 05/23/2009 Document Revised: 07/20/2016 Document Reviewed: 07/20/2016 °Elsevier Interactive Patient Education © 2017 [employer] ° °Bacterial Vaginosis °Bacterial vaginosis is an infection of the vagina. It happens when too many germs (bacteria) grow in the vagina. This  infection puts you at risk for infections from sex (STIs). Treating this infection can lower your risk for some STIs. You should also treat this if you are pregnant. It can cause your baby to be born early. °Follow these instructions at home: °Medicines  °· Take over-the-counter and prescription medicines only as told by your doctor. °· Take or use your antibiotic medicine as told by your doctor. Do not stop taking or using it even if you start to feel better. °General instructions  °· If you your sexual partner is a woman, tell her that you have this infection. She needs to get treatment if she has symptoms. If you have a female partner, he does not need to be treated. °· During treatment: °¨ Avoid sex. °¨ Do not douche. °¨ Avoid alcohol as told. °¨ Avoid breastfeeding as told. °· Drink enough fluid to keep your pee (urine) clear or pale yellow. °· Keep your vagina and butt (rectum) clean. °¨ Wash the area with warm water every day. °¨ Wipe from front to back after you use the toilet. °· Keep all follow-up visits as told by your doctor. This is important. °Preventing this condition  °· Do not douche. °· Use only warm water to wash around your vagina. °· Use protection when you have sex. This includes: °¨ Latex condoms. °¨ Dental dams. °· Limit how many people you have sex with. It is best to only have sex with the same person (be monogamous). °· Get tested for STIs. Have your partner get tested. °· Wear underwear   that is cotton or lined with cotton. °· Avoid tight pants and pantyhose. This is most important in summer. °· Do not use any products that have nicotine or tobacco in them. These include cigarettes and e-cigarettes. If you need help quitting, ask your doctor. °· Do not use illegal drugs. °· Limit how much alcohol you drink. °Contact a doctor if: °· Your symptoms do not get better, even after you are treated. °· You have more discharge or pain when you pee (urinate). °· You have a fever. °· You have pain in  your belly (abdomen). °· You have pain with sex. °· Your bleed from your vagina between periods. °Summary °· This infection happens when too many germs (bacteria) grow in the vagina. °· Treating this condition can lower your risk for some infections from sex (STIs). °· You should also treat this if you are pregnant. It can cause early (premature) birth. °· Do not stop taking or using your antibiotic medicine even if you start to feel better. °This information is not intended to replace advice given to you by your health care provider. Make sure you discuss any questions you have with your health care provider. °Document Released: 05/04/2008 Document Revised: 04/10/2016 Document Reviewed: 04/10/2016 °Elsevier Interactive Patient Education © 2017 [employer] ° °

## 2017-12-21 NOTE — MAU Note (Signed)
Pt reports abnormal bleeding for the last 2 months, spotting followed by a few days of period like bleeding and then spotting again. Cramping off/on

## 2017-12-21 NOTE — MAU Provider Note (Signed)
History     CSN: [id number]  Arrival date and time: 12/21/17 6578   First Provider Initiated Contact with Patient 12/21/17 1939      Chief Complaint  Patient presents with  . Vaginal Bleeding   Ms.Lori Vaughn is a 32 y.o. female G2P0010 non pregnant female here in MAU with abnormal vaginal bleeding.    Vaginal Bleeding  The patient's primary symptoms include vaginal bleeding. This is a recurrent problem. The current episode started more than 1 month ago (2 months ago). The problem occurs every several days. The problem has been unchanged. The pain is mild. The problem affects both sides. She is not pregnant. Associated symptoms include abdominal pain. The vaginal discharge was bloody and scant. The vaginal bleeding is spotting. She has not been passing clots. She has not been passing tissue. She has tried nothing for the symptoms. She is sexually active. She uses nothing for contraception.     OB History    Gravida  2   Para  0   Term  0   Preterm  0   AB  1   Living  0     SAB  0   TAB  1   Ectopic  0   Multiple  0   Live Births              Past Medical History:  Diagnosis Date  . Amenorrhea 01/28/2017  . Amenorrhea, secondary 01/28/2017  . Bacterial vaginitis 01/28/2017  . Elevated serum hCG 01/28/2017   HCG 26 on 01/28/17  . Migraines   . PCOS (polycystic ovarian syndrome)     Past Surgical History:  Procedure Laterality Date  . NASAL SINUS SURGERY      Family History  Problem Relation Age of Onset  . Diabetes Father   . Hypertension Father   . Diabetes Maternal Grandmother   . Diabetes Paternal Grandmother     Social History   Tobacco Use  . Smoking status: Never Smoker  . Smokeless tobacco: Never Used  Substance Use Topics  . Alcohol use: No  . Drug use: No    Allergies:  Allergies  Allergen Reactions  . Molds & Smuts Other (See Comments)    Nasal polyps    Medications Prior to Admission  Medication Sig Dispense Refill  Last Dose  . ibuprofen (ADVIL,MOTRIN) 600 MG tablet Take 1 tablet (600 mg total) by mouth every 6 (six) hours as needed. (Patient not taking: Reported on 03/24/2017) 30 tablet 0 Not Taking at Unknown time  . ondansetron (ZOFRAN ODT) 4 MG disintegrating tablet Take 1 tablet (4 mg total) by mouth every 8 (eight) hours as needed for nausea or vomiting. 6 tablet 0   . Prenatal MV-Min-Fe Fum-FA-DHA (PRENATAL 1) 30-0.975-200 MG CAPS Take 1 tablet by mouth daily. (Patient not taking: Reported on 03/24/2017) 30 capsule 12 Not Taking at Unknown time   Results for orders placed or performed during the hospital encounter of 12/21/17 (from the past 48 hour(s))  GC/Chlamydia probe amp ([place])not at [facility]     Status: None   Collection Time: 12/21/17 12:00 AM  Result Value Ref Range   Chlamydia Negative     Comment: Normal Reference Range - Negative   Neisseria gonorrhea Negative     Comment: Normal Reference Range - Negative  Urinalysis, Routine w reflex microscopic     Status: Abnormal   Collection Time: 12/21/17  6:46 PM  Result Value Ref Range   Color, Urine YELLOW YELLOW  APPearance HAZY (A) CLEAR   Specific Gravity, Urine 1.013 1.005 - 1.030   pH 5.0 5.0 - 8.0   Glucose, UA NEGATIVE NEGATIVE mg/dL   Hgb urine dipstick LARGE (A) NEGATIVE   Bilirubin Urine NEGATIVE NEGATIVE   Ketones, ur NEGATIVE NEGATIVE mg/dL   Protein, ur NEGATIVE NEGATIVE mg/dL   Nitrite NEGATIVE NEGATIVE   Leukocytes, UA MODERATE (A) NEGATIVE   RBC / HPF 0-5 0 - 5 RBC/hpf   WBC, UA 21-50 0 - 5 WBC/hpf   Bacteria, UA RARE (A) NONE SEEN   Squamous Epithelial / LPF 21-50 0 - 5   Mucus PRESENT     Comment: Performed at [facility], [address] [place], [place] [address]  Pregnancy, urine POC     Status: None   Collection Time: 12/21/17  7:02 PM  Result Value Ref Range   Preg Test, Ur NEGATIVE NEGATIVE    Comment:        THE SENSITIVITY OF THIS METHODOLOGY IS >24 mIU/mL   Wet prep, genital     Status:  Abnormal   Collection Time: 12/21/17  8:00 PM  Result Value Ref Range   Yeast Wet Prep HPF POC NONE SEEN NONE SEEN   Trich, Wet Prep NONE SEEN NONE SEEN   Clue Cells Wet Prep HPF POC PRESENT (A) NONE SEEN   WBC, Wet Prep HPF POC FEW (A) NONE SEEN    Comment: MANY BACTERIA SEEN   Sperm NONE SEEN     Comment: Performed at [facility], [address], [place], [place] [address]   Review of Systems  Gastrointestinal: Positive for abdominal pain.  Genitourinary: Positive for vaginal bleeding.  Neurological: Negative for dizziness.   Physical Exam   Blood pressure 97/62, pulse 82, temperature 98.4 F (36.9 C), temperature source Oral, resp. rate 16, height 6' (1.829 m), weight 183 lb (83 kg), SpO2 98 %, unknown if currently breastfeeding.  Physical Exam  Constitutional: She is oriented to person, place, and time. She appears well-developed and well-nourished. No distress.  HENT:  Head: Normocephalic.  Eyes: Pupils are equal, round, and reactive to light.  Neck: Neck supple.  Respiratory: Effort normal.  GI: Soft. She exhibits no distension. There is no tenderness. There is no rebound.  Musculoskeletal: Normal range of motion.  Neurological: She is alert and oriented to person, place, and time.  Skin: Skin is warm. She is not diaphoretic.  Psychiatric: Her behavior is normal.   MAU Course  Procedures  None   MDM   Wet prep and GC  Assessment and Plan   A:  1. Abnormal vaginal bleeding   2. Abdominal cramping   3. BV (bacterial vaginosis)     P:  Discharge home in stable condition Pelvic rest Bleeding precautions  Rx: ibuprofen, provera, flagyl (no alcohol)  Follow up with GYN  Duane Lope, NP 12/22/2017 7:20 PM

## 2017-12-22 LAB — GC/CHLAMYDIA PROBE AMP (~~LOC~~) NOT AT ARMC
Chlamydia: NEGATIVE
NEISSERIA GONORRHEA: NEGATIVE

## 2017-12-29 ENCOUNTER — Telehealth: Payer: Self-pay | Admitting: Obstetrics & Gynecology

## 2017-12-29 NOTE — Telephone Encounter (Signed)
Patient have some questions, she's having some bleeding and would like to speak with a nurse

## 2018-01-03 NOTE — Telephone Encounter (Signed)
Spoke with patient and her problem has resolved.

## 2018-01-25 ENCOUNTER — Encounter (HOSPITAL_COMMUNITY): Payer: Self-pay | Admitting: *Deleted

## 2018-01-25 ENCOUNTER — Other Ambulatory Visit: Payer: Self-pay

## 2018-01-25 ENCOUNTER — Inpatient Hospital Stay (HOSPITAL_COMMUNITY)
Admission: AD | Admit: 2018-01-25 | Discharge: 2018-01-25 | Disposition: A | Discharge status: Home / Self Care | Payer: BC Managed Care – PPO | Source: Ambulatory Visit | Attending: Obstetrics & Gynecology | Admitting: Obstetrics & Gynecology

## 2018-01-25 DIAGNOSIS — Z3202 Encounter for pregnancy test, result negative: Secondary | ICD-10-CM | POA: Diagnosis not present

## 2018-01-25 DIAGNOSIS — N939 Abnormal uterine and vaginal bleeding, unspecified: Secondary | ICD-10-CM | POA: Insufficient documentation

## 2018-01-25 DIAGNOSIS — E282 Polycystic ovarian syndrome: Secondary | ICD-10-CM | POA: Insufficient documentation

## 2018-01-25 LAB — URINALYSIS, ROUTINE W REFLEX MICROSCOPIC
Bilirubin Urine: NEGATIVE
Glucose, UA: NEGATIVE mg/dL
Hgb urine dipstick: NEGATIVE
Ketones, ur: NEGATIVE mg/dL
Leukocytes, UA: NEGATIVE
Nitrite: NEGATIVE
Protein, ur: NEGATIVE mg/dL
Specific Gravity, Urine: 1.015 (ref 1.005–1.030)
pH: 6 (ref 5.0–8.0)

## 2018-01-25 LAB — HCG, SERUM, QUALITATIVE: Preg, Serum: NEGATIVE

## 2018-01-25 LAB — POCT PREGNANCY, URINE: Preg Test, Ur: NEGATIVE

## 2018-01-25 NOTE — MAU Note (Deleted)
Pt given GI cocktail and was reevaluated for heartburn/chest pain pt reports NO chest pain after taking this medication. Pt sent back to lobby until lab results are completed.

## 2018-01-25 NOTE — Discharge Instructions (Signed)
Dysfunctional Uterine Bleeding °Dysfunctional uterine bleeding is abnormal bleeding from the uterus. Dysfunctional uterine bleeding includes: °· A period that comes earlier or later than usual. °· A period that is lighter, heavier, or has blood clots. °· Bleeding between periods. °· Skipping one or more periods. °· Bleeding after sexual intercourse. °· Bleeding after menopause. ° °Follow these instructions at home: °Pay attention to any changes in your symptoms. Follow these instructions to help with your condition: °Eating and drinking °· Eat well-balanced meals. Include foods that are high in iron, such as liver, meat, shellfish, green leafy vegetables, and eggs. °· If you become constipated: °? Drink plenty of water. °? Eat fruits and vegetables that are high in water and fiber, such as spinach, carrots, raspberries, apples, and mango. °Medicines °· Take over-the-counter and prescription medicines only as told by your health care provider. °· Do not change medicines without talking with your health care provider. °· Aspirin or medicines that contain aspirin may make the bleeding worse. Do not take those medicines: °? During the week before your period. °? During your period. °· If you were prescribed iron pills, take them as told by your health care provider. Iron pills help to replace iron that your body loses because of this condition. °Activity °· If you need to change your sanitary pad or tampon more than one time every 2 hours: °? Lie in bed with your feet raised (elevated). °? Place a cold pack on your lower abdomen. °? Rest as much as possible until the bleeding stops or slows down. °· Do not try to lose weight until the bleeding has stopped and your blood iron level is back to normal. °Other Instructions °· For two months, write down: °? When your period starts. °? When your period ends. °? When any abnormal bleeding occurs. °? What problems you notice. °· Keep all follow up visits as told by your health  care provider. This is important. °Contact a health care provider if: °· You get light-headed or weak. °· You have nausea and vomiting. °· You cannot eat or drink without vomiting. °· You feel dizzy or have diarrhea while you are taking medicines. °· You are taking birth control pills or hormones, and you want to change them or stop taking them. °Get help right away if: °· You develop a fever or chills. °· You need to change your sanitary pad or tampon more than one time per hour. °· Your bleeding becomes heavier, or your flow contains clots more often. °· You develop pain in your abdomen. °· You lose consciousness. °· You develop a rash. °This information is not intended to replace advice given to you by your health care provider. Make sure you discuss any questions you have with your health care provider. °Document Released: 07/23/2000 Document Revised: 01/01/2016 Document Reviewed: 10/21/2014 °Elsevier Interactive Patient Education © 2018 [employer] ° °

## 2018-01-25 NOTE — MAU Provider Note (Signed)
Chief Complaint:  Possible Pregnancy   First Provider Initiated Contact with Patient 01/25/18 2245      HPI: Lori Vaughn is a 32 y.o. G2P0010 who presents to maternity admissions reporting brown discharge and appetite change.  Is sure she is pregnant. Does not accept urine pregnancy test result.  Insists on blood test.  Had a good withdrawal bleed post Provera (has PCOS) and then had brown discharge this week.  States last time she saw that she was pregnant.  Period is due this week. . She reports vaginal bleeding, vaginal itching/burning, urinary symptoms, h/a, dizziness, n/v, or fever/chills.    Possible Pregnancy  This is a new problem. The current episode started today. Pertinent negatives include no abdominal pain, chills, fever, myalgias, nausea, urinary symptoms or vomiting. Nothing aggravates the symptoms. She has tried nothing for the symptoms.   RN Note: Last time she was here, she came because she had a long period of bleeding.  Was rx  Provera for 10 days.  Before she finished it, she had a little bleeding.  Bled after she finished, looked like a regular period.  Had unprotected prior to the last bleeding and again after she stopped bleeding. Wk later, started having like this weird brown d/c.  Last time this happened she was pregnant.  Only last 2 days.  Neg HPT.  Hx of PCOS.  Is so tired, thinks it is ridiculous, appetite is crazy.  Wants a blood test. Has a strange medal taste.    Past Medical History: Past Medical History:  Diagnosis Date  . Amenorrhea 01/28/2017  . Amenorrhea, secondary 01/28/2017  . Bacterial vaginitis 01/28/2017  . Elevated serum hCG 01/28/2017   HCG 26 on 01/28/17  . Migraines   . PCOS (polycystic ovarian syndrome)     Past obstetric history: OB History  Gravida Para Term Preterm AB Living  2 0 0 0 1 0  SAB TAB Ectopic Multiple Live Births  0 1 0 0      # Outcome Date GA Lbr Len/2nd Weight Sex Delivery Anes PTL Lv  2 Gravida           1 TAB              Past Surgical History: Past Surgical History:  Procedure Laterality Date  . NASAL SINUS SURGERY      Family History: Family History  Problem Relation Age of Onset  . Diabetes Father   . Hypertension Father   . Diabetes Maternal Grandmother   . Diabetes Paternal Grandmother     Social History: Social History   Tobacco Use  . Smoking status: Never Smoker  . Smokeless tobacco: Never Used  Substance Use Topics  . Alcohol use: No  . Drug use: No    Allergies:  Allergies  Allergen Reactions  . Molds & Smuts Other (See Comments)    Nasal polyps    Meds:  No medications prior to admission.    I have reviewed patient's Past Medical Hx, Surgical Hx, Family Hx, Social Hx, medications and allergies.  ROS:  Review of Systems  Constitutional: Negative for chills and fever.  Gastrointestinal: Negative for abdominal pain, nausea and vomiting.  Musculoskeletal: Negative for myalgias.   Other systems negative     Physical Exam   Patient Vitals for the past 24 hrs:  BP Temp Temp src Pulse Resp SpO2 Weight  01/25/18 2307 119/70 98.2 F (36.8 C) Oral (!) 106 18 - -  01/25/18 1906 130/74 97.8 F (  36.6 C) Oral 85 16 100 % 190 lb 4 oz (86.3 kg)   Constitutional: Well-developed, well-nourished female in no acute distress.  Cardiovascular: normal rate and rhythm, no ectopy audible, S1 & S2 heard, no murmur Respiratory: normal effort, no distress. Lungs CTAB with no wheezes or crackles GI: Abd soft, non-tender.  Nondistended.  No rebound, No guarding.  Bowel Sounds audible  MS: Extremities nontender, no edema, normal ROM Neurologic: Alert and oriented x 4.   Grossly nonfocal. GU: Neg CVAT. Skin:  Warm and Dry Psych:  Affect appropriate.  PELVIC EXAM: Declines pelvic exam, states just wanted a pregnancy test    Labs: Results for orders placed or performed during the hospital encounter of 01/25/18 (from the past 24 hour(s))  Urinalysis, Routine w reflex  microscopic     Status: None   Collection Time: 01/25/18  7:43 PM  Result Value Ref Range   Color, Urine YELLOW YELLOW   APPearance CLEAR CLEAR   Specific Gravity, Urine 1.015 1.005 - 1.030   pH 6.0 5.0 - 8.0   Glucose, UA NEGATIVE NEGATIVE mg/dL   Hgb urine dipstick NEGATIVE NEGATIVE   Bilirubin Urine NEGATIVE NEGATIVE   Ketones, ur NEGATIVE NEGATIVE mg/dL   Protein, ur NEGATIVE NEGATIVE mg/dL   Nitrite NEGATIVE NEGATIVE   Leukocytes, UA NEGATIVE NEGATIVE  Pregnancy, urine POC     Status: None   Collection Time: 01/25/18  7:56 PM  Result Value Ref Range   Preg Test, Ur NEGATIVE NEGATIVE  hCG, serum, qualitative     Status: None   Collection Time: 01/25/18 10:03 PM  Result Value Ref Range   Preg, Serum NEGATIVE NEGATIVE   --/--/B POS (06/23 [id number])  Imaging:  No results found.  MAU Course/MDM: I have ordered labs as follows:  Urine and blood pregnancy test., both neg Imaging ordered: none Results reviewed. Discussed period is likely coming soon. Discharge is probably old blood from last period .   Pt stable at time of discharge.  Assessment: 1. Abnormal uterine bleeding (AUB)   2.     Negative pregnancy test  Plan: Discharge home Recommend Monitor cycles Followup in clinic   Follow-up Information    [facility] Schedule an appointment as soon as possible for a visit.   Specialty:  Obstetrics and Gynecology Contact information: [address] [place] [id number] [phone]          Encouraged to return here or to other Urgent Care/ED if she develops worsening of symptoms, increase in pain, fever, or other concerning symptoms.   Wynelle BourgeoisMarie Ranyah Groeneveld CNM, MSN Certified Nurse-Midwife 01/26/2018 1:27 AM

## 2018-01-25 NOTE — MAU Note (Signed)
Last time she was here, she came because she had a long period of bleeding.  Was rx  Provera for 10 days.  Before she finished it, she had a little bleeding.  Bled after she finished, looked like a regular period.  Had unprotected prior to the last bleeding and again after she stopped bleeding. Wk later, started having like this weird brown d/c.  Last time this happened she was pregnant.  Only last 2 days.  Neg HPT.  Hx of PCOS.  Is so tired, thinks it is ridiculous, appetite is crazy.  Wants a blood test. Has a strange medal taste.

## 2018-03-27 ENCOUNTER — Ambulatory Visit (HOSPITAL_COMMUNITY)
Admission: EM | Admit: 2018-03-27 | Discharge: 2018-03-27 | Disposition: A | Discharge status: Home / Self Care | Payer: BC Managed Care – PPO | Attending: Family Medicine | Admitting: Family Medicine

## 2018-03-27 ENCOUNTER — Encounter (HOSPITAL_COMMUNITY): Payer: Self-pay | Admitting: Emergency Medicine

## 2018-03-27 DIAGNOSIS — J0100 Acute maxillary sinusitis, unspecified: Secondary | ICD-10-CM

## 2018-03-27 MED ORDER — AMOXICILLIN-POT CLAVULANATE 875-125 MG PO TABS: 1.0000 | ORAL_TABLET | Freq: Two times a day (BID) | ORAL | 0 refills | Status: DC

## 2018-03-27 MED ORDER — FLUCONAZOLE 150 MG PO TABS: 150.0000 mg | ORAL_TABLET | Freq: Every day | ORAL | 1 refills | Status: DC | PRN

## 2018-03-27 NOTE — ED Triage Notes (Signed)
Pt here for sinus pressure and congestion; pt sts hx of sinus sx

## 2018-03-27 NOTE — ED Provider Notes (Signed)
[facility]   [id number] 03/27/18 Arrival Time: 1123  ASSESSMENT & PLAN:  1. Acute non-recurrent maxillary sinusitis     Meds ordered this encounter  Medications  . fluconazole (DIFLUCAN) 150 MG tablet    Sig: Take 1 tablet (150 mg total) by mouth daily as needed.    Dispense:  1 tablet    Refill:  1  . amoxicillin-clavulanate (AUGMENTIN) 875-125 MG tablet    Sig: Take 1 tablet by mouth every 12 (twelve) hours.    Dispense:  20 tablet    Refill:  0   Discussed typical duration of symptoms. OTC symptom care as needed. Ensure adequate fluid intake and rest. May f/u with PCP or here as needed.  Reviewed expectations re: course of current medical issues. Questions answered. Outlined signs and symptoms indicating need for more acute intervention. Patient verbalized understanding. After Visit Summary given.   SUBJECTIVE: History from: patient.  Lori Vaughn is a 32 y.o. female who presents with complaint of nasal congestion, post-nasal drainage, and sinus pain. Onset gradual, approximately 2 weeks ago. Respiratory symptoms: none Fever: no. Overall normal PO intake without n/v. OTC treatment: decongestant without much help. Hot weather makes symptoms worse. Thick nasal drainage. History of frequent sinus infections: occasional; last about 1 year ago.  Social History   Tobacco Use  Smoking Status Never Smoker  Smokeless Tobacco Never Used    ROS: As per HPI.   OBJECTIVE:  Vitals:   03/27/18 1216  BP: 111/73  Pulse: 60  Resp: 18  Temp: 98.1 F (36.7 C)  TempSrc: Oral  SpO2: 100%     General appearance: alert; appears fatigued HEENT: nasal congestion; clear runny nose; throat irritation secondary to post-nasal drainage; bilateral maxillary tenderness to palpation; turbinates boggy Neck: supple without LAD Lungs: unlabored respirations, symmetrical air entry; cough: absent; no respiratory distress Skin: warm and dry Psychological: alert and  cooperative; normal mood and affect  Allergies  Allergen Reactions  . Molds & Smuts Other (See Comments)    Nasal polyps    Past Medical History:  Diagnosis Date  . Amenorrhea 01/28/2017  . Amenorrhea, secondary 01/28/2017  . Bacterial vaginitis 01/28/2017  . Elevated serum hCG 01/28/2017   HCG 26 on 01/28/17  . Migraines   . PCOS (polycystic ovarian syndrome)    Family History  Problem Relation Age of Onset  . Diabetes Father   . Hypertension Father   . Diabetes Maternal Grandmother   . Diabetes Paternal Grandmother    Social History   Socioeconomic History  . Marital status: Single    Spouse name: Not on file  . Number of children: Not on file  . Years of education: Not on file  . Highest education level: Not on file  Occupational History  . Not on file  Social Needs  . Financial resource strain: Not on file  . Food insecurity:    Worry: Not on file    Inability: Not on file  . Transportation needs:    Medical: Not on file    Non-medical: Not on file  Tobacco Use  . Smoking status: Never Smoker  . Smokeless tobacco: Never Used  Substance and Sexual Activity  . Alcohol use: No  . Drug use: No  . Sexual activity: Yes  Lifestyle  . Physical activity:    Days per week: Not on file    Minutes per session: Not on file  . Stress: Not on file  Relationships  . Social connections:  Talks on phone: Not on file    Gets together: Not on file    Attends religious service: Not on file    Active member of club or organization: Not on file    Attends meetings of clubs or organizations: Not on file    Relationship status: Not on file  . Intimate partner violence:    Fear of current or ex partner: Not on file    Emotionally abused: Not on file    Physically abused: Not on file    Forced sexual activity: Not on file  Other Topics Concern  . Not on file  Social History Narrative  . Not on file            Mardella LaymanHagler, Cyrah Mclamb, MD 03/27/18 1233

## 2018-08-29 ENCOUNTER — Ambulatory Visit (INDEPENDENT_AMBULATORY_CARE_PROVIDER_SITE_OTHER): Payer: BC Managed Care – PPO | Admitting: Family Medicine

## 2018-08-29 ENCOUNTER — Encounter: Payer: Self-pay | Admitting: Family Medicine

## 2018-08-29 VITALS — BP 108/78 | HR 62 | Temp 98.5°F | Ht 71.0 in | Wt 194.0 lb

## 2018-08-29 DIAGNOSIS — Z8669 Personal history of other diseases of the nervous system and sense organs: Secondary | ICD-10-CM

## 2018-08-29 DIAGNOSIS — Z8709 Personal history of other diseases of the respiratory system: Secondary | ICD-10-CM

## 2018-08-29 DIAGNOSIS — G629 Polyneuropathy, unspecified: Secondary | ICD-10-CM | POA: Diagnosis not present

## 2018-08-29 DIAGNOSIS — E282 Polycystic ovarian syndrome: Secondary | ICD-10-CM | POA: Diagnosis not present

## 2018-08-29 DIAGNOSIS — Z7689 Persons encountering health services in other specified circumstances: Secondary | ICD-10-CM

## 2018-08-29 LAB — POCT GLYCOSYLATED HEMOGLOBIN (HGB A1C): Hemoglobin A1C: 4.9 % (ref 4.0–5.6)

## 2018-08-29 NOTE — Patient Instructions (Signed)
Diet for Polycystic Ovary Syndrome  Polycystic ovary syndrome (PCOS) is a disorder of the chemicals (hormones) that regulate a woman's reproductive system, including monthly periods (menstruation). The condition causes important hormones to be out of balance. PCOS can:   Stop your periods or make them irregular.   Cause cysts to develop on your ovaries.   Make it difficult to get pregnant.   Stop your body from responding to the effects of insulin (insulin resistance). Insulin resistance can lead to obesity and diabetes.  Changing what you eat can help you manage PCOS and improve your health. Following a balanced diet can help you lose weight and improve the way that your body uses insulin.  What are tips for following this plan?   Follow a balanced diet for meals and snacks. Eat breakfast, lunch, dinner, and one or two snacks every day.   Include protein in each meal and snack.   Choose whole grains instead of products that are made with refined flour.   Eat a variety of foods.   Exercise regularly as told by your health care provider. Aim to do 30 or more minutes of exercise on most days of the week.   If you are overweight or obese:  ? Pay attention to how many calories you eat. Cutting down on calories can help you lose weight.  ? Work with your health care provider or a diet and nutrition specialist (dietitian) to figure out how many calories you need each day.  What foods can I eat?    Fruits  Include a variety of colors and types. All fruits are helpful for PCOS.  Vegetables  Include a variety of colors and types. All vegetables are helpful for PCOS.  Grains  Whole grains, such as whole wheat. Whole-grain breads, crackers, cereals, and pasta. Unsweetened oatmeal, bulgur, barley, quinoa, and brown rice. Tortillas made from corn or whole-wheat flour.  Meats and other proteins  Low-fat (lean) proteins, such as fish, chicken, beans, eggs, and tofu.  Dairy  Low-fat dairy products, such as skim milk,  cheese sticks, and yogurt.  Beverages  Low-fat or fat-free drinks, such as water, low-fat milk, sugar-free drinks, and small amounts of 100% fruit juice.  Seasonings and condiments  Ketchup. Mustard. Barbecue sauce. Relish. Low-fat or fat-free mayonnaise.  Fats and oils  Olive oil or canola oil. Walnuts and almonds.  The items listed above may not be a complete list of recommended foods and beverages. Contact a dietitian for more options.  What foods are not recommended?  Foods that are high in calories or fat. Fried foods. Sweets. Products that are made from refined white flour, including white bread, pastries, white rice, and pasta.  The items listed above may not be a complete list of foods and beverages to avoid. Contact a dietitian for more information.  Summary   PCOS is a hormonal imbalance that affects a woman's reproductive system.   You can help to manage your PCOS by exercising regularly and eating a healthy, varied diet of vegetables, fruit, whole grains, low-fat (lean) protein, and low-fat dairy products.   Changing what you eat can improve the way that your body uses insulin, help your hormones reach normal levels, and help you lose weight.  This information is not intended to replace advice given to you by your health care provider. Make sure you discuss any questions you have with your health care provider.  Document Released: 11/17/2015 Document Revised: 05/30/2017 Document Reviewed: 05/30/2017  Elsevier Interactive Patient   Education  2019 Elsevier Inc.        Polycystic Ovarian Syndrome    Polycystic ovarian syndrome (PCOS) is a common hormonal disorder among women of reproductive age. In most women with PCOS, many small fluid-filled sacs (cysts) grow on the ovaries, and the cysts are not part of a normal menstrual cycle. PCOS can cause problems with your menstrual periods and make it difficult to get pregnant. It can also cause an increased risk of miscarriage with pregnancy. If it is not  treated, PCOS can lead to serious health problems, such as diabetes and heart disease.  What are the causes?  The cause of PCOS is not known, but it may be the result of a combination of certain factors, such as:   Irregular menstrual cycle.   High levels of certain hormones (androgens).   Problems with the hormone that helps to control blood sugar (insulin resistance).   Certain genes.  What increases the risk?  This condition is more likely to develop in women who have a family history of PCOS.  What are the signs or symptoms?  Symptoms of PCOS may include:   Multiple ovarian cysts.   Infrequent periods or no periods.   Periods that are too frequent or too heavy.   Unpredictable periods.   Inability to get pregnant (infertility) because of not ovulating.   Increased growth of hair on the face, chest, stomach, back, thumbs, thighs, or toes.   Acne or oily skin. Acne may develop during adulthood, and it may not respond to treatment.   Pelvic pain.   Weight gain or obesity.   Patches of thickened and dark brown or black skin on the neck, arms, breasts, or thighs (acanthosis nigricans).   Excess hair growth on the face, chest, abdomen, or upper thighs (hirsutism).  How is this diagnosed?  This condition is diagnosed based on:   Your medical history.   A physical exam, including a pelvic exam. Your health care provider may look for areas of increased hair growth on your skin.   Tests, such as:  ? Ultrasound. This may be used to examine the ovaries and the lining of the uterus (endometrium) for cysts.  ? Blood tests. These may be used to check levels of sugar (glucose), female hormone (testosterone), and female hormones (estrogen and progesterone) in your blood.  How is this treated?  There is no cure for PCOS, but treatment can help to manage symptoms and prevent more health problems from developing. Treatment varies depending on:   Your symptoms.   Whether you want to have a baby or whether you need  birth control (contraception).  Treatment may include nutrition and lifestyle changes along with:   Progesterone hormone to start a menstrual period.   Birth control pills to help you have regular menstrual periods.   Medicines to make you ovulate, if you want to get pregnant.   Medicine to reduce excessive hair growth.   Surgery, in severe cases. This may involve making small holes in one or both of your ovaries. This decreases the amount of testosterone that your body produces.  Follow these instructions at home:   Take over-the-counter and prescription medicines only as told by your health care provider.   Follow a healthy meal plan. This can help you reduce the effects of PCOS.  ? Eat a healthy diet that includes lean proteins, complex carbohydrates, fresh fruits and vegetables, low-fat dairy products, and healthy fats. Make sure to eat enough fiber.     If you are overweight, lose weight as told by your health care provider.  ? Losing 10% of your body weight may improve symptoms.  ? Your health care provider can determine how much weight loss is best for you and can help you lose weight safely.   Keep all follow-up visits as told by your health care provider. This is important.  Contact a health care provider if:   Your symptoms do not get better with medicine.   You develop new symptoms.  This information is not intended to replace advice given to you by your health care provider. Make sure you discuss any questions you have with your health care provider.  Document Released: 11/19/2004 Document Revised: 03/23/2016 Document Reviewed: 01/11/2016  Elsevier Interactive Patient Education  2019 Elsevier Inc.

## 2018-08-29 NOTE — Progress Notes (Signed)
Patient presents to clinic today to establish care.  SUBJECTIVE: PMH: Pt is a 33 yo female with pmg sig for h/o asthma, migraines, PCOS.  Pt was previously seen by Dr. Nicholaus Bloom.  Now followed by Dr. Maxie Better.    PCOS: -dx'd in 2015/16 -has never been on medication -endorses irregular menses until recently and excess facial hair -LMP 07/26/18 -was on birth control, but made her "crazy" -pt hopes to become pregnant in Novemeber 2020 -has and appt in a few wks for transvaginal u/s -pt's mom and grandmother also have pcos  Migraines: -1-2 HAs/wk -pain in L frontal area above eye and in occipital area. -may take Tylenol -denies n/v, changes in vision -pt notes a sharp " electric type pain" top of her foot up the right side of her body.   -Pain may have been shortly before or after having a migraine. -Drinking 3-4 bottles of water per day, may have tea 2-3 times per week -Getting 5 hours of sleep per night  Nerve pain: -Patient notes a sharp "electric-like pain" at the top of her right foot -Pain travels up patient's right side -May last a few seconds -pain described as "paralyzing" -May occur prior to or shortly after a migraine -Has been occurring for the last few months. -pt would like to see a Neruologist, Dr. Patrcia Dolly  H/o asthma: -Patient endorses history of exercise-induced asthma -No symptoms since high school -Does not require an inhaler currently  Allergies: NKDA  Past surgical history: Sinus surgery 2008  Social history: Patient is single.  Pt is an new relationship.  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  Pt plans to complete her degree program from the summer 2020.  Pt endorses alcohol use and marijuana use.  Patient denies tobacco use.  Pt endorses  smoking marijuana daily times several years.  Health Maintenance: Dental --[facility] --[place], [place] PAP --08/18/2018  Past Medical History:  Diagnosis Date  . Amenorrhea 01/28/2017  . Amenorrhea, secondary 01/28/2017  . Bacterial vaginitis 01/28/2017  . Elevated serum hCG 01/28/2017   HCG 26 on 01/28/17  . Migraines   . PCOS (polycystic ovarian syndrome)   . PCOS (polycystic ovarian syndrome)     Past Surgical History:  Procedure Laterality Date  . NASAL SINUS SURGERY      No current outpatient medications on file prior to visit.   No current facility-administered medications on file prior to visit.     Allergies  Allergen Reactions  . Molds & Smuts Other (See Comments)    Nasal polyps    Family History  Problem Relation Age of Onset  . Diabetes Father   . Hypertension Father   . Diabetes Maternal Grandmother   . Diabetes Paternal Grandmother     Social History   Socioeconomic History  . Marital status: Single    Spouse name: Not on file  . Number of children: Not on file  . Years of education: Not on file  . Highest education level: Not on file  Occupational History  . Not on file  Social Needs  . Financial resource strain: Not on file  . Food insecurity:    Worry: Not on file    Inability: Not on file  . Transportation needs:    Medical: Not on file  Non-medical: Not on file  Tobacco Use  . Smoking status: Never Smoker  . Smokeless tobacco: Never Used  Substance and Sexual Activity  . Alcohol use: No  . Drug use: No  . Sexual activity: Yes  Lifestyle  . Physical activity:    Days per week: Not on file    Minutes per session: Not on file  . Stress: Not on file  Relationships  . Social connections:    Talks on phone: Not on file    Gets together: Not on file    Attends religious service: Not on file    Active member of club or organization: Not on file    Attends meetings of clubs or organizations: Not on file     Relationship status: Not on file  . Intimate partner violence:    Fear of current or ex partner: Not on file    Emotionally abused: Not on file    Physically abused: Not on file    Forced sexual activity: Not on file  Other Topics Concern  . Not on file  Social History Narrative  . Not on file    ROS General: Denies fever, chills, night sweats, changes in weight, changes in appetite HEENT: Denies ear pain, changes in vision, rhinorrhea, sore throat  +migraines, facial hair CV: Denies CP, palpitations, SOB, orthopnea Pulm: Denies SOB, cough, wheezing GI: Denies abdominal pain, nausea, vomiting, diarrhea, constipation GU: Denies dysuria, hematuria, frequency, vaginal discharge Msk: Denies muscle cramps, joint pains  Neuro: Denies weakness, numbness, tingling  +sharp pain from foot up R side of body Skin: Denies rashes, bruising Psych: Denies depression, anxiety, hallucinations  BP 108/78 (BP Location: Left Arm, Patient Position: Sitting, Cuff Size: Normal)   Pulse 62   Temp 98.5 F (36.9 C) (Oral)   Ht 5\' 11"  (1.803 m)   Wt 194 lb (88 kg)   LMP 07/26/2018 (Exact Date)   SpO2 98%   BMI 27.06 kg/m   Physical Exam Gen. Pleasant, well developed, well-nourished, in NAD HEENT - [place]/AT, PERRL, EOMI, no nystagmus no scleral icterus, no nasal drainage, pharynx without erythema or exudate. Lungs: no use of accessory muscles, no dullness to percussion, CTAB, no wheezes, rales or rhonchi Cardiovascular: RRR, No r/g/m, no peripheral edema Abdomen: BS present, soft, nontender,nondistended Musculoskeletal: No deformities, moves all four extremities, no cyanosis or clubbing, normal tone. Neuro:  A&Ox3, CN II-XII intact, normal gait.  UE and LE reflexes 2+ Skin:  Warm, dry, intact, no lesions.  Hyperpigmentation of anterior neck and chin.  Recent Results (from the past 2160 hour(s))  POC HgB A1c     Status: None   Collection Time: 08/29/18  4:34 PM  Result Value Ref Range   Hemoglobin  A1C 4.9 4.0 - 5.6 %   HbA1c POC (<> result, manual entry)     HbA1c, POC (prediabetic range)     HbA1c, POC (controlled diabetic range)      Assessment/Plan: PCOS (polycystic ovarian syndrome)  -discussed various options regarding treatment including metformin -will defer to OB/Gyn as pt has a upcoming appointment for u/s -given handout regarding foods that can help symptoms - Plan: POC HgB A1c  4.9% today  History of migraine  -discussed increasing p.o. intake of water, decreasing caffeine intake, increasing sleep, reducing stress -Given handout - Plan: Ambulatory referral to Neurology  Neuropathy  -discussed various causes -consider checking B12, folate - Plan: Ambulatory referral to Neurology  History of asthma -stable -symptoms more during childhood -continue to monitor  Encounter to establish care -We reviewed the PMH, PSH, FH, SH, Meds and Allergies. -We provided refills for any medications we will prescribe as needed. -We addressed current concerns per orders and patient instructions. -We have asked for records for pertinent exams, studies, vaccines and notes from previous providers. -We have advised patient to follow up per instructions below.  F/u prn  Abbe AmsterdamShannon Happy [place], MD

## 2018-08-30 ENCOUNTER — Encounter: Payer: Self-pay | Admitting: Neurology

## 2018-09-01 ENCOUNTER — Ambulatory Visit: Payer: BC Managed Care – PPO | Admitting: Family Medicine

## 2018-09-26 ENCOUNTER — Encounter (HOSPITAL_COMMUNITY): Payer: Self-pay

## 2018-09-26 ENCOUNTER — Other Ambulatory Visit: Payer: Self-pay

## 2018-09-26 ENCOUNTER — Ambulatory Visit (HOSPITAL_COMMUNITY)
Admission: EM | Admit: 2018-09-26 | Discharge: 2018-09-26 | Disposition: A | Discharge status: Home / Self Care | Payer: BC Managed Care – PPO | Attending: Family Medicine | Admitting: Family Medicine

## 2018-09-26 DIAGNOSIS — J111 Influenza due to unidentified influenza virus with other respiratory manifestations: Secondary | ICD-10-CM

## 2018-09-26 DIAGNOSIS — R69 Illness, unspecified: Secondary | ICD-10-CM

## 2018-09-26 MED ORDER — CETIRIZINE HCL 10 MG PO CAPS: 10.0000 mg | ORAL_CAPSULE | Freq: Every day | ORAL | 0 refills | Status: DC

## 2018-09-26 MED ORDER — IBUPROFEN 600 MG PO TABS: 600.0000 mg | ORAL_TABLET | Freq: Four times a day (QID) | ORAL | 0 refills | Status: DC | PRN

## 2018-09-26 MED ORDER — PSEUDOEPH-BROMPHEN-DM 30-2-10 MG/5ML PO SYRP: 5.0000 mL | ORAL_SOLUTION | Freq: Four times a day (QID) | ORAL | 0 refills | Status: DC | PRN

## 2018-09-26 NOTE — ED Triage Notes (Addendum)
Pt presents to [facility] for flu-like symptoms x3 days, pt complains of generalized body aches, cough, fever, and chest hurts from coughing. Pt has been taking OTC medications to treat symptoms but has no relief, pt has also had some rounds of diarrhea.

## 2018-09-26 NOTE — ED Provider Notes (Signed)
[facility]    CSN: [id number] Arrival date & time: 09/26/18  1719     History   Chief Complaint Chief Complaint  Patient presents with  . Influenza    HPI Lori Vaughn is a 33 y.o. female history of PCOS, migraines presenting today for evaluation of URI symptoms.  Patient states that over the past 3 to 4 days she has developed cough, congestion, body aches, chills and fever.  She is also had chest discomfort related to coughing.  She notes that she did recently travel, but was not international.  She is unsure of if she had any exposure to active coronavirus or travelers from her wound.  Significant other with similar symptoms.  HPI  Past Medical History:  Diagnosis Date  . Amenorrhea 01/28/2017  . Amenorrhea, secondary 01/28/2017  . Bacterial vaginitis 01/28/2017  . Elevated serum hCG 01/28/2017   HCG 26 on 01/28/17  . Migraines   . PCOS (polycystic ovarian syndrome)   . PCOS (polycystic ovarian syndrome)     Patient Active Problem List   Diagnosis Date Noted  . History of migraine 08/29/2018  . Amenorrhea 01/28/2017  . Bacterial vaginitis 01/28/2017  . Elevated serum hCG 01/28/2017  . PCOS (polycystic ovarian syndrome) 01/30/2016    Past Surgical History:  Procedure Laterality Date  . NASAL SINUS SURGERY      OB History    Gravida  2   Para  0   Term  0   Preterm  0   AB  1   Living  0     SAB  0   TAB  1   Ectopic  0   Multiple  0   Live Births               Home Medications    Prior to Admission medications   Medication Sig Start Date End Date Taking? Authorizing Provider  brompheniramine-pseudoephedrine-DM 30-2-10 MG/5ML syrup Take 5 mLs by mouth 4 (four) times daily as needed. 09/26/18   Wieters, Hallie C, PA-C  Cetirizine HCl 10 MG CAPS Take 1 capsule (10 mg total) by mouth daily for 10 days. 09/26/18 10/06/18  Wieters, Hallie C, PA-C  ibuprofen (ADVIL,MOTRIN) 600 MG tablet Take 1 tablet (600 mg total) by  mouth every 6 (six) hours as needed. 09/26/18   Wieters, Junius CreamerHallie C, PA-C    Family History Family History  Problem Relation Age of Onset  . Diabetes Father   . Hypertension Father   . Diabetes Maternal Grandmother   . Diabetes Paternal Grandmother     Social History Social History   Tobacco Use  . Smoking status: Never Smoker  . Smokeless tobacco: Never Used  Substance Use Topics  . Alcohol use: Yes  . Drug use: Yes    Types: Marijuana     Allergies   Molds & smuts   Review of Systems Review of Systems  Constitutional: Positive for appetite change, chills, fatigue and fever. Negative for activity change.  HENT: Positive for congestion, rhinorrhea and sore throat. Negative for ear pain, sinus pressure and trouble swallowing.   Eyes: Negative for discharge and redness.  Respiratory: Positive for cough. Negative for chest tightness and shortness of breath.   Cardiovascular: Negative for chest pain.  Gastrointestinal: Positive for diarrhea. Negative for abdominal pain, nausea and vomiting.  Musculoskeletal: Positive for myalgias.  Skin: Negative for rash.  Neurological: Negative for dizziness, light-headedness and headaches.     Physical Exam Triage Vital Signs ED  Triage Vitals  Enc Vitals Group     BP 09/26/18 1743 123/78     Pulse Rate 09/26/18 1743 80     Resp 09/26/18 1743 17     Temp 09/26/18 1743 99.7 F (37.6 C)     Temp Source 09/26/18 1743 Oral     SpO2 09/26/18 1743 99 %     Weight --      Height --      Head Circumference --      Peak Flow --      Pain Score 09/26/18 1745 7     Pain Loc --      Pain Edu? --      Excl. in GC? --    No data found.  Updated Vital Signs BP 123/78 (BP Location: Left Arm)   Pulse 80   Temp 99.7 F (37.6 C) (Oral)   Resp 17   LMP 09/13/2018 (Exact Date)   SpO2 99%   Visual Acuity Right Eye Distance:   Left Eye Distance:   Bilateral Distance:    Right Eye Near:   Left Eye Near:    Bilateral Near:      Physical Exam Vitals signs and nursing note reviewed.  Constitutional:      General: She is not in acute distress.    Appearance: She is well-developed.  HENT:     Head: Normocephalic and atraumatic.     Ears:     Comments: Bilateral ears without tenderness to palpation of external auricle, tragus and mastoid, EAC's without erythema or swelling, TM's with good bony landmarks and cone of light. Non erythematous.    Mouth/Throat:     Comments: Oral mucosa pink and moist, no tonsillar enlargement or exudate. Posterior pharynx patent and nonerythematous, no uvula deviation or swelling. Normal phonation. Eyes:     Conjunctiva/sclera: Conjunctivae normal.  Neck:     Musculoskeletal: Neck supple.  Cardiovascular:     Rate and Rhythm: Normal rate and regular rhythm.     Heart sounds: No murmur.  Pulmonary:     Effort: Pulmonary effort is normal. No respiratory distress.     Breath sounds: Normal breath sounds.     Comments: Breathing comfortably at rest, CTABL, no wheezing, rales or other adventitious sounds auscultated Abdominal:     Palpations: Abdomen is soft.     Tenderness: There is no abdominal tenderness.  Skin:    General: Skin is warm and dry.  Neurological:     Mental Status: She is alert.      UC Treatments / Results  Labs (all labs ordered are listed, but only abnormal results are displayed) Labs Reviewed - No data to display  EKG None  Radiology No results found.  Procedures Procedures (including critical care time)  Medications Ordered in UC Medications - No data to display  Initial Impression / Assessment and Plan / UC Course  I have reviewed the triage vital signs and the nursing notes.  Pertinent labs & imaging results that were available during my care of the patient were reviewed by me and considered in my medical decision making (see chart for details).     URI symptoms x3 to 4 days, low-grade fever in clinic today, most likely influenza-like  illness.  Will continue symptomatic and supportive care.  Continue to monitor, lungs clear at this time, symptoms worsening or fever persisting will have patient follow-up.Discussed strict return precautions. Patient verbalized understanding and is agreeable with plan.  Final Clinical Impressions(s) / UC  Diagnoses   Final diagnoses:  Influenza-like illness     Discharge Instructions     Most likely flu, typically lasts approximately 1 week. Follow up if symptoms not resolving or worsening, fever persisting  1. Take a daily allergy pill/anti-histamine like Zyrtec, Claritin, or Store brand consistently for 2 weeks  2. For congestion you may try an oral decongestant like Mucinex or sudafed. You may also try intranasal flonase nasal spray or saline irrigations (neti pot, sinus cleanse)  3. For your sore throat you may try cepacol lozenges, salt water gargles, throat spray. Treatment of congestion may also help your sore throat.  4. For cough you may try cough syrup provided, delsym or Robitussen, Mucinex DM  5. Take Tylenol or Ibuprofen to help with pain/inflammation- Alternate every 4 hours  6. Stay hydrated, drink plenty of fluids to keep throat coated and less irritated  Honey Tea For cough/sore throat try using a honey-based tea. Use 3 teaspoons of honey with juice squeezed from half lemon. Place shaved pieces of ginger into 1/2-1 cup of water and warm over stove top. Then mix the ingredients and repeat every 4 hours as needed.   ED Prescriptions    Medication Sig Dispense Auth. Provider   brompheniramine-pseudoephedrine-DM 30-2-10 MG/5ML syrup Take 5 mLs by mouth 4 (four) times daily as needed. 120 mL Wieters, Hallie C, PA-C   Cetirizine HCl 10 MG CAPS Take 1 capsule (10 mg total) by mouth daily for 10 days. 10 capsule Wieters, Hallie C, PA-C   ibuprofen (ADVIL,MOTRIN) 600 MG tablet Take 1 tablet (600 mg total) by mouth every 6 (six) hours as needed. 30 tablet Wieters, Pennock C,  PA-C     Controlled Substance Prescriptions [place] Controlled Substance Registry consulted? Not Applicable   Lew Dawes, [place] 09/26/18 1850

## 2018-09-26 NOTE — Discharge Instructions (Addendum)
Most likely flu, typically lasts approximately 1 week. Follow up if symptoms not resolving or worsening, fever persisting  1. Take a daily allergy pill/anti-histamine like Zyrtec, Claritin, or Store brand consistently for 2 weeks  2. For congestion you may try an oral decongestant like Mucinex or sudafed. You may also try intranasal flonase nasal spray or saline irrigations (neti pot, sinus cleanse)  3. For your sore throat you may try cepacol lozenges, salt water gargles, throat spray. Treatment of congestion may also help your sore throat.  4. For cough you may try cough syrup provided, delsym or Robitussen, Mucinex DM  5. Take Tylenol or Ibuprofen to help with pain/inflammation- Alternate every 4 hours  6. Stay hydrated, drink plenty of fluids to keep throat coated and less irritated  Honey Tea For cough/sore throat try using a honey-based tea. Use 3 teaspoons of honey with juice squeezed from half lemon. Place shaved pieces of ginger into 1/2-1 cup of water and warm over stove top. Then mix the ingredients and repeat every 4 hours as needed.

## 2018-09-26 NOTE — ED Notes (Signed)
Pt discharged by provider.

## 2018-10-13 ENCOUNTER — Encounter: Payer: Self-pay | Admitting: Neurology

## 2018-10-27 ENCOUNTER — Ambulatory Visit (INDEPENDENT_AMBULATORY_CARE_PROVIDER_SITE_OTHER): Payer: BC Managed Care – PPO | Admitting: Family Medicine

## 2018-10-27 ENCOUNTER — Other Ambulatory Visit: Payer: Self-pay

## 2018-10-27 ENCOUNTER — Ambulatory Visit: Payer: BC Managed Care – PPO | Admitting: Neurology

## 2018-10-27 ENCOUNTER — Telehealth: Payer: Self-pay | Admitting: Family Medicine

## 2018-10-27 VITALS — HR 78 | Temp 99.8°F

## 2018-10-27 DIAGNOSIS — R509 Fever, unspecified: Secondary | ICD-10-CM | POA: Diagnosis not present

## 2018-10-27 DIAGNOSIS — R0602 Shortness of breath: Secondary | ICD-10-CM

## 2018-10-27 DIAGNOSIS — R6889 Other general symptoms and signs: Secondary | ICD-10-CM

## 2018-10-27 DIAGNOSIS — J019 Acute sinusitis, unspecified: Secondary | ICD-10-CM | POA: Diagnosis not present

## 2018-10-27 MED ORDER — AMOXICILLIN-POT CLAVULANATE 875-125 MG PO TABS: 1.0000 | ORAL_TABLET | Freq: Two times a day (BID) | ORAL | 0 refills | Status: DC

## 2018-10-27 MED ORDER — E-Z SPACER DEVI: 2 refills | Status: DC

## 2018-10-27 MED ORDER — ALBUTEROL SULFATE HFA 108 (90 BASE) MCG/ACT IN AERS: 2.0000 | INHALATION_SPRAY | Freq: Four times a day (QID) | RESPIRATORY_TRACT | 0 refills | Status: DC | PRN

## 2018-10-27 NOTE — Progress Notes (Signed)
Lori Vaughn DOB: 1986/02/27 Encounter date: 10/27/2018  This is a 33 y.o. female who presents with Chief Complaint  Patient presents with  . Cough  . Headache  . URI    History of present illness:  Patient was at the airport a few weeks ago and sat on a plane on the runway for an hour and a half that ended up not departing due to concern for coronavirus.  Plane was headed back to [place].  Significant other and herself started with flulike symptoms within a few days of sitting on the airplane. They had fever, chills, body aches started with some cough.  This week patient started to have diarrhea daily as well as significant nasal congestion and sinus pressure.  Cough is continued to worsen and now she is feeling some chest tightness and shortness of breath.  Pulse ox today was difficult to obtain due to nail shellac, but toe pulse ox remained at 92%.  Also difficult to obtain due to shellac on the toes.  He difficulty with breathing in the past.  She denies any history of prolonged coughs or use for inhalers.  Continuing to feel more and more rundown.    She feels like it is difficult to get in large breath and feels more short of breath with activity   Allergies  Allergen Reactions  . Molds & Smuts Other (See Comments)    Nasal polyps   No outpatient medications have been marked as taking for the 10/27/18 encounter (Office Visit) with Wynn Banker, MD.    Review of Systems  Constitutional: Positive for appetite change, chills, fatigue and fever.  HENT: Positive for congestion and sore throat.   Respiratory: Positive for cough and shortness of breath.     Objective:  There were no vitals taken for this visit.      BP Readings from Last 3 Encounters:  09/26/18 123/78  08/29/18 108/78  03/27/18 111/73   Wt Readings from Last 3 Encounters:  08/29/18 194 lb (88 kg)  01/25/18 190 lb 4 oz (86.3 kg)  12/21/17 183 lb (83 kg)    Physical  Exam Constitutional:      Appearance: She is ill-appearing. She is not toxic-appearing.  HENT:     Head: Normocephalic and atraumatic.     Right Ear: Tympanic membrane, ear canal and external ear normal.     Left Ear: Tympanic membrane, ear canal and external ear normal.     Mouth/Throat:     Mouth: Mucous membranes are moist.     Pharynx: Oropharynx is clear. Uvula midline. No oropharyngeal exudate or posterior oropharyngeal erythema.  Cardiovascular:     Heart sounds: Normal heart sounds.  Pulmonary:     Effort: Pulmonary effort is normal.     Breath sounds: Decreased breath sounds (slightly) present.  Lymphadenopathy:     Head:     Right side of head: Submandibular adenopathy present.     Left side of head: Submandibular adenopathy present.     Cervical: Cervical adenopathy present.     Upper Body:     Right upper body: No supraclavicular adenopathy.     Left upper body: No supraclavicular adenopathy.     Assessment/Plan  1. Flu-like symptoms Sent for coronavirus testing.  I do worry about exposure from plane as well as worsening respiratory symptoms without history of respiratory disease. - Novel Coronavirus, [facility] ([facility])  Drive up testing site only; Future  2. Acute non-recurrent sinusitis, unspecified location We discussed possibility  that this is secondary bacterial infection status post viral infection.  I did send in Augmentin for her in order to attempt to treat a possible sinus infection.  If any worsening of symptoms please proceed to the ER. - amoxicillin-clavulanate (AUGMENTIN) 875-125 MG tablet; Take 1 tablet by mouth 2 (two) times daily.  Dispense: 20 tablet; Refill: 0  3. Shortness of breath We discussed use of inhaler especially over the weekend of twice daily.  She needs to be evaluated if any worsening of breathing. - albuterol (PROVENTIL HFA;VENTOLIN HFA) 108 (90 Base) MCG/ACT inhaler; Inhale 2 puffs into the lungs every 6 (six) hours as needed for wheezing  or shortness of breath.  Dispense: 1 Inhaler; Refill: 0 - Spacer/Aero-Holding Chambers (E-Z SPACER) inhaler; Use as instructed  Dispense: 1 each; Refill: 2  4. Fever, unspecified fever cause See above.   Return if symptoms worsen or fail to improve.      Theodis Shove, MD

## 2018-10-27 NOTE — Patient Instructions (Signed)
Person Under Monitoring Name: Lori Vaughn  Location: [address] [place] [address]   Infection Prevention Recommendations for Individuals Confirmed to have, or Being Evaluated for, 2019 Novel Coronavirus (COVID-19) Infection Who Receive Care at Home  Individuals who are confirmed to have, or are being evaluated for, COVID-19 should follow the prevention steps below until a healthcare provider or local or state health department says they can return to normal activities.  Stay home except to get medical care You should restrict activities outside your home, except for getting medical care. Do not go to work, school, or public areas, and do not use public transportation or taxis.  Call ahead before visiting your doctor Before your medical appointment, call the healthcare provider and tell them that you have, or are being evaluated for, COVID-19 infection. This will help the healthcare providers office take steps to keep other people from getting infected. Ask your healthcare provider to call the local or state health department.  Monitor your symptoms Seek prompt medical attention if your illness is worsening (e.g., difficulty breathing). Before going to your medical appointment, call the healthcare provider and tell them that you have, or are being evaluated for, COVID-19 infection. Ask your healthcare provider to call the local or state health department.  Wear a facemask You should wear a facemask that covers your nose and mouth when you are in the same room with other people and when you visit a healthcare provider. People who live with or visit you should also wear a facemask while they are in the same room with you.  Separate yourself from other people in your home As much as possible, you should stay in a different room from other people in your home. Also, you should use a separate bathroom, if available.  Avoid sharing household  items You should not share dishes, drinking glasses, cups, eating utensils, towels, bedding, or other items with other people in your home. After using these items, you should wash them thoroughly with soap and water.  Cover your coughs and sneezes Cover your mouth and nose with a tissue when you cough or sneeze, or you can cough or sneeze into your sleeve. Throw used tissues in a lined trash can, and immediately wash your hands with soap and water for at least 20 seconds or use an alcohol-based hand rub.  Wash your [employer] your hands often and thoroughly with soap and water for at least 20 seconds. You can use an alcohol-based hand sanitizer if soap and water are not available and if your hands are not visibly dirty. Avoid touching your eyes, nose, and mouth with unwashed hands.   Prevention Steps for Caregivers and Household Members of Individuals Confirmed to have, or Being Evaluated for, COVID-19 Infection Being Cared for in the Home  If you live with, or provide care at home for, a person confirmed to have, or being evaluated for, COVID-19 infection please follow these guidelines to prevent infection:  Follow healthcare providers instructions Make sure that you understand and can help the patient follow any healthcare provider instructions for all care.  Provide for the patients basic needs You should help the patient with basic needs in the home and provide support for getting groceries, prescriptions, and other personal needs.  Monitor the patients symptoms If they are getting sicker, call his or her medical provider and tell them that the patient has, or is being evaluated for, COVID-19 infection. This will help the healthcare  providers office take steps to keep other people from getting infected. Ask the healthcare provider to call the local or state health department.  Limit the number of people who have contact with the patient  If possible, have only one  caregiver for the patient.  Other household members should stay in another home or place of residence. If this is not possible, they should stay  in another room, or be separated from the patient as much as possible. Use a separate bathroom, if available.  Restrict visitors who do not have an essential need to be in the home.  Keep older adults, very young children, and other sick people away from the patient Keep older adults, very young children, and those who have compromised immune systems or chronic health conditions away from the patient. This includes people with chronic heart, lung, or kidney conditions, diabetes, and cancer.  Ensure good ventilation Make sure that shared spaces in the home have good air flow, such as from an air conditioner or an opened window, weather permitting.  Wash your hands often  Wash your hands often and thoroughly with soap and water for at least 20 seconds. You can use an alcohol based hand sanitizer if soap and water are not available and if your hands are not visibly dirty.  Avoid touching your eyes, nose, and mouth with unwashed hands.  Use disposable paper towels to dry your hands. If not available, use dedicated cloth towels and replace them when they become wet.  Wear a facemask and gloves  Wear a disposable facemask at all times in the room and gloves when you touch or have contact with the patients blood, body fluids, and/or secretions or excretions, such as sweat, saliva, sputum, nasal mucus, vomit, urine, or feces.  Ensure the mask fits over your nose and mouth tightly, and do not touch it during use.  Throw out disposable facemasks and gloves after using them. Do not reuse.  Wash your hands immediately after removing your facemask and gloves.  If your personal clothing becomes contaminated, carefully remove clothing and launder. Wash your hands after handling contaminated clothing.  Place all used disposable facemasks, gloves, and  other waste in a lined container before disposing them with other household waste.  Remove gloves and wash your hands immediately after handling these items.  Do not share dishes, glasses, or other household items with the patient  Avoid sharing household items. You should not share dishes, drinking glasses, cups, eating utensils, towels, bedding, or other items with a patient who is confirmed to have, or being evaluated for, COVID-19 infection.  After the person uses these items, you should wash them thoroughly with soap and water.  Wash laundry thoroughly  Immediately remove and wash clothes or bedding that have blood, body fluids, and/or secretions or excretions, such as sweat, saliva, sputum, nasal mucus, vomit, urine, or feces, on them.  Wear gloves when handling laundry from the patient.  Read and follow directions on labels of laundry or clothing items and detergent. In general, wash and dry with the warmest temperatures recommended on the label.  Clean all areas the individual has used often  Clean all touchable surfaces, such as counters, tabletops, doorknobs, bathroom fixtures, toilets, phones, keyboards, tablets, and bedside tables, every day. Also, clean any surfaces that may have blood, body fluids, and/or secretions or excretions on them.  Wear gloves when cleaning surfaces the patient has come in contact with.  Use a diluted bleach solution (e.g., dilute bleach with  1 part bleach and 10 parts water) or a household disinfectant with a label that says EPA-registered for coronaviruses. To make a bleach solution at home, add 1 tablespoon of bleach to 1 quart (4 cups) of water. For a larger supply, add  cup of bleach to 1 gallon (16 cups) of water.  Read labels of cleaning products and follow recommendations provided on product labels. Labels contain instructions for safe and effective use of the cleaning product including precautions you should take when applying the product,  such as wearing gloves or eye protection and making sure you have good ventilation during use of the product.  Remove gloves and wash hands immediately after cleaning.  Monitor yourself for signs and symptoms of illness Caregivers and household members are considered close contacts, should monitor their health, and will be asked to limit movement outside of the home to the extent possible. Follow the monitoring steps for close contacts listed on the symptom monitoring form.   ? If you have additional questions, contact your local health department or call the epidemiologist on call at [phone] (available 24/7). ? This guidance is subject to change. For the most up-to-date guidance from [facility], please refer to their website: TripMetro.hu     Person Under Monitoring Name: Lori Vaughn  Location: [address] [place] [address]   CORONAVIRUS DISEASE 2019 (COVID-19) Guidance for Persons Under Investigation You are being tested for the virus that causes coronavirus disease 2019 (COVID-19). Public health actions are necessary to ensure protection of your health and the health of others, and to prevent further spread of infection. COVID-19 is caused by a virus that can cause symptoms, such as fever, cough, and shortness of breath. The primary transmission from person to person is by coughing or sneezing. On September 07, 2018, the World Health Organization announced a [employer] Emergency of International Concern and on September 08, 2018 the U.S. Department of Health and Human Services declared a public health emergency. If the virus that causesCOVID-19 spreads in the community, it could have severe public health consequences.  As a person under investigation for COVID-19, the [employer] of Health and [employer], Division of [employer] advises you to adhere to the following  guidance until your test results are reported to you. If your test result is positive, you will receive additional information from your provider and your local health department at that time.   Remain at home until you are cleared by your health provider or public health authorities.   Keep a log of visitors to your home using the form provided. Any visitors to your home must be aware of your isolation status.  If you plan to move to a new address or leave the county, notify the local health department in your county.  Call a doctor or seek care if you have an urgent medical need. Before seeking medical care, call ahead and get instructions from the provider before arriving at the medical office, clinic or [facility]. Notify them that you are being tested for the virus that causes COVID-19 so arrangements can be made, as necessary, to prevent transmission to others in the healthcare setting. Next, notify the local health department in your county.  If a medical emergency arises and you need to call 911, inform the first responders that you are being tested for the virus that causes COVID-19. Next, notify the local health department in your county.  Adhere to all guidance set forth by the  [employer] Division of Public Health for Home Care of patients that is based on guidance from the Center for Disease Control and Prevention with suspected or confirmed COVID-19. It is provided with this guidance for Persons Under Investigation.  Your health and the health of our community are our top priorities. Public Health officials remain available to provide assistance and counseling to you about COVID-19 and compliance with this guidance.  Provider: ____________________________________________________________ Date: ______/_____/_________  By signing below, you acknowledge that you have read and agree to comply with this Guidance for Persons Under  Investigation. ______________________________________________________________ Date: ______/_____/_________  WHO DO I CALL? You can find a list of local health departments here: [email or web address] Health Department: ____________________________________________________________________ Contact Name: ________________________________________________________________________ Telephone: ___________________________________________________________________________  Nedra Hai, Division of Public Health, Communicable Disease Branch COVID-19 Guidance for Persons Under Investigation October 14, 2018  Person Under Monitoring Name: Lori Vaughn  Location: [address] [place] [address]   Record here the list of visitors to your home since you became ill with respiratory symptoms that led you to consult a health provider:  Visitor Name Date Time In Time Out Did this person come within 6 feet of you? Indicate Y or N Relationship to Person Under Monitoring Phone number Comments   ___/____/____ __:__ AM/PM __:__ AM/PM       ___/____/____ __:__ AM/PM __:__ AM/PM       ___/____/____ __:__ AM/PM __:__ AM/PM       ___/____/____ __:__ AM/PM __:__ AM/PM       ___/____/____ __:__ AM/PM __:__ AM/PM       ___/____/____ __:__ AM/PM __:__ AM/PM       ___/____/____ __:__ AM/PM __:__ AM/PM       ___/____/____ __:__ AM/PM __:__ AM/PM       ___/____/____ __:__ AM/PM __:__ AM/PM       ___/____/____ __:__ AM/PM __:__ AM/PM       ___/____/____ __:__ AM/PM __:__ AM/PM       ___/____/____ __:__ AM/PM __:__ AM/PM       ___/____/____ __:__ AM/PM __:__ AM/PM       ___/____/____ __:__ AM/PM __:__ AM/PM       Nedra Hai, Division of Public Health, Communicable Disease Branch

## 2018-10-27 NOTE — Telephone Encounter (Signed)
Questions for Screening COVID-19  Symptom onset: Started about 2-3 weeks ago after traveling, sx's improved and then Started back 3/16 Fever -- taking Tylenol/Ibuprofen Chills Cough - Progressively worsening - a lot of mucous production, white to yellow in color --Mucinex for cough Body aches Runny nose  Sore throat all week -- getting worse   Travel or Contacts:  Flight to [place] about 2-3 weeks ago -- got sick after plane travel, got better and then sx's escalated to now  -- Pt diagnosed [facility] with "flu-like symptoms" , never tested.   Contact with coworker - traveled to [place] and was advised that she had PNA -- sx's of Covid-19   During this illness, did/does the patient experience any of the following symptoms? Fever >100.19F [x]   Yes []   No []   Unknown  99.8 on 3/16 Subjective fever (felt feverish) [x]   Yes []   No []   Unknown Chills [x]   Yes []   No []   Unknown Muscle aches (myalgia) [x]   Yes []   No []   Unknown Runny nose (rhinorrhea) [x]   Yes []   No []   Unknown Sore throat [x]   Yes []   No []   Unknown Cough (new onset or worsening of chronic cough) [x]   Yes []   No []   Unknown Shortness of breath (dyspnea) [x]   Yes []   No []   Unknown  Breathing is harder, more struggle Nausea or vomiting []   Yes [x]   No []   Unknown Headache []   Yes [x]   No []   Unknown Abdominal pain  []   Yes [x]   No []   Unknown Diarrhea (?3 loose/looser than normal stools/24hr period) [x]   Yes []   No []   Unknown all week Other, specify:  Patient risk factors: Smoker? [x]   Current []   Former []   Never   Occ Marijuana If female, currently pregnant? []   Yes [x]   No  Trying to conceive  Patient Active Problem List   Diagnosis Date Noted  . History of migraine 08/29/2018  . Amenorrhea 01/28/2017  . Bacterial vaginitis 01/28/2017  . Elevated serum hCG 01/28/2017  . PCOS (polycystic ovarian syndrome) 01/30/2016    Plan:  []   High risk for COVID-19 with red flags go to ED (with CP, SOB, weak/lightheaded, or  fever > 101.5). Call ahead.  [x]   High risk for COVID-19 but stable will have car visit. Inform provider and coordinate time. Will be completed in afternoon. []   No red flags but URI signs or symptoms will go through side door and be seen in dedicated room.  Note: Referral to telemedicine is an appropriate alternative disposition for higher risk but stable. [facility]/e-Visit: [phone].

## 2018-10-31 ENCOUNTER — Telehealth: Payer: Self-pay | Admitting: Family Medicine

## 2018-10-31 NOTE — Telephone Encounter (Signed)
Spoke with pt voiced understanding that her COVID 19 results are not back yet. Pt requests if dr Loni Beckwith can refill her Amoxicillin Rx since she states that she dropped them in the toilet. Please advise

## 2018-10-31 NOTE — Telephone Encounter (Signed)
Copied from CRM [id number]. Topic: Quick Communication - See Telephone Encounter >> Oct 31, 2018 11:30 AM Arlyss Gandy, NT wrote: CRM for notification. See Telephone encounter for: 10/31/18. Pt calling to check status on COVID-19 results. Also states she dropped the amoxicillin in the toilet and needs a new rx sent in to her pharmacy.

## 2018-10-31 NOTE — Telephone Encounter (Signed)
[facility] has advised outstanding COVID results likely not available until Thu/Fri this week.

## 2018-11-01 ENCOUNTER — Other Ambulatory Visit: Payer: Self-pay | Admitting: Family Medicine

## 2018-11-01 ENCOUNTER — Ambulatory Visit: Payer: Self-pay

## 2018-11-01 DIAGNOSIS — J019 Acute sinusitis, unspecified: Secondary | ICD-10-CM

## 2018-11-01 MED ORDER — AMOXICILLIN-POT CLAVULANATE 875-125 MG PO TABS: 1.0000 | ORAL_TABLET | Freq: Two times a day (BID) | ORAL | 0 refills | Status: AC

## 2018-11-01 NOTE — Telephone Encounter (Signed)
Incoming call from Patient requesting lab results.  Relating Dr.  Hassan Rowan  Message.  to Patient.  Patient voiced understanding.  Will do as instructed.

## 2018-11-01 NOTE — Telephone Encounter (Signed)
Left message for patient to call back. CRM created 

## 2018-11-01 NOTE — Telephone Encounter (Signed)
I have sent in refill (7 day) to [facility]. Results are still not back, but we will call her when we see them! They are delayed unfortunately. However, I definitely recommend she remain in quarentine for the 14 days because there is always chance of false negative result, and even if she is negative, she has been dealing with illness for quite some time and with immune system being run down, she is at higher risk of getting more sick. Let us know if any worsening of symptoms or concerns; otherwise we will be in touch once we see results.

## 2018-11-05 LAB — NOVEL CORONAVIRUS, NAA: SARS-CoV-2, NAA: NOT DETECTED

## 2018-11-06 ENCOUNTER — Ambulatory Visit: Payer: BC Managed Care – PPO | Admitting: Neurology

## 2018-11-06 NOTE — Telephone Encounter (Signed)
Pt called for her COVID 19 test results,Pt  verbalized understanding that the results are Negative

## 2018-11-30 NOTE — Progress Notes (Signed)
New Patient Virtual Visit via Video Note The purpose of this virtual visit is to provide medical care while limiting exposure to the novel coronavirus.    Consent was obtained for video visit:  Yes.   Answered questions that patient had about telehealth interaction:  Yes.   I discussed the limitations, risks, security and privacy concerns of performing an evaluation and management service by telemedicine. I also discussed with the patient that there may be a patient responsible charge related to this service. The patient expressed understanding and agreed to proceed.  Pt location: Home Physician Location: office Name of referring provider:  Deeann Saint, MD I connected with [name] at patients initiation/request on 12/04/2018 at  2:00 PM EDT by video enabled telemedicine application and verified that I am speaking with the correct person using two identifiers. Pt MRN:  [id number] Pt DOB:  15-Feb-1986 Video Participants:  Lori Vaughn    History of Present Illness: Lori Vaughn is a 33 y.o. right-handed African American female with PCOS and migraines presenting for evaluation of migraines and right foot pain.   She began having migraines several years ago and has not needed to treat this with preventative medication.  Starting early in 2019, she began having increased frequency of migraines.  Pain is located over the left frontal, orbital, temporal and occipital region and described as dull, achy.  Headaches occur 1-2 times per week, lasting anywhere from a few minutes to all day.  She had associated nausea, photophobia, and phonophobia.   She smokes marijuana which eases her pain.  Caffeine and exercise exacerbates pain.   Over the past month, her migraines have drastically improved and she reports that her last headache was 3 weeks ago.  She is not having any more ongoing pain.  Her bigger complaint is one-year history of right foot pain,  located at the dorsum of the ankle.  Pain is sharp and severe, worse with dorsiflexion and weight bearing.  She does not have numbness/tingling or weakness. When severe, her pain feels like it's radiating up her entire body.  She used to play college basketball and admits to injuring her ankle several times.    Out-side paper records, electronic medical record, and images have been reviewed where available and summarized as:  Lab Results  Component Value Date   HGBA1C 4.9 08/29/2018    Lab Results  Component Value Date   TSH 0.94 01/30/2016     Past Medical History:  Diagnosis Date  . Amenorrhea 01/28/2017  . Amenorrhea, secondary 01/28/2017  . Bacterial vaginitis 01/28/2017  . Elevated serum hCG 01/28/2017   HCG 26 on 01/28/17  . Migraines   . PCOS (polycystic ovarian syndrome)   . PCOS (polycystic ovarian syndrome)     Past Surgical History:  Procedure Laterality Date  . NASAL SINUS SURGERY       Medications:  Outpatient Encounter Medications as of 12/04/2018  Medication Sig  . penicillin v potassium (VEETID) 500 MG tablet   . [DISCONTINUED] albuterol (PROVENTIL HFA;VENTOLIN HFA) 108 (90 Base) MCG/ACT inhaler Inhale 2 puffs into the lungs every 6 (six) hours as needed for wheezing or shortness of breath.  . [DISCONTINUED] brompheniramine-pseudoephedrine-DM 30-2-10 MG/5ML syrup Take 5 mLs by mouth 4 (four) times daily as needed.  . [DISCONTINUED] Cetirizine HCl 10 MG CAPS Take 1 capsule (10 mg total) by mouth daily for 10 days.  . [DISCONTINUED] ibuprofen (ADVIL,MOTRIN) 600 MG tablet Take 1 tablet (600 mg total) by mouth  every 6 (six) hours as needed.  . [DISCONTINUED] Spacer/Aero-Holding Chambers (E-Z SPACER) inhaler Use as instructed   No facility-administered encounter medications on file as of 12/04/2018.     Allergies:  Allergies  Allergen Reactions  . Molds & Smuts Other (See Comments)    Nasal polyps    Family History: Family History  Problem Relation Age of  Onset  . Diabetes Father   . Hypertension Father   . Cystic fibrosis Father   . Diabetes Maternal Grandmother   . Diabetes Paternal Grandmother   . Bipolar disorder Mother   . Depression Mother   . Healthy Sister     Social History: Social History   Tobacco Use  . Smoking status: Never Smoker  . Smokeless tobacco: Never Used  Substance Use Topics  . Alcohol use: Yes    Comment: Occasionally  . Drug use: Yes    Types: Marijuana   Social History   Social History Narrative   She works as Programmer, applications at [employer].   Highest level of education:  Will be completing doctorate in [employer]   She lives with boyfriend and his cousin.     Review of Systems:  CONSTITUTIONAL: No fevers, chills, night sweats, or weight loss.   EYES: No visual changes or eye pain ENT: No hearing changes.  No history of nose bleeds.   RESPIRATORY: No cough, wheezing and shortness of breath.   CARDIOVASCULAR: Negative for chest pain, and palpitations.   GI: Negative for abdominal discomfort, blood in stools or black stools.  No recent change in bowel habits.   GU:  No history of incontinence.   MUSCLOSKELETAL: +history of joint pain or swelling.  No myalgias.   SKIN: Negative for lesions, rash, and itching.   HEMATOLOGY/ONCOLOGY: Negative for prolonged bleeding, bruising easily, and swollen nodes.  No history of cancer.   ENDOCRINE: Negative for cold or heat intolerance, polydipsia or goiter.   PSYCH:  No depression or anxiety symptoms.   NEURO: As Above.   General Medical Exam:  Well appearing, comfortable.  Nonlabored breathing.  No deformity or edema.  No rash.  Neurological Exam: MENTAL STATUS including orientation to time, place, person, recent and remote memory, attention span and concentration, language, and fund of knowledge is normal.  Speech is not dysarthric.  CRANIAL NERVES:  Normal conjugate, extra-ocular eye movements in all directions of gaze.  No ptosis.   Normal facial symmetry and movements.  Normal shoulder shrug and head rotation.  Tongue is midline.  MOTOR:  Antigravity in all extremities.  No abnormal movements.  No pronator drift  COORDINATION/GAIT: Normal finger to nose bilaterally.  Intact rapid alternating movements bilaterally.  Able to rise from a chair without using arms.  Gait narrow based and stable. Tandem and stressed gait intact.    IMPRESSION: 1.  Episodic migraine without aura, improved with last headache 3 weeks ago.   - OK to use tylenol or NSAIDs for pain, limit to twice per week  - If no improvement, start triptan  - Discouraged from using marijuana to treat headaches  2.  Right foot pain, localized over the talonavicular joint is most suggestive of musculoskeletal pathology.  Reassured patient that her symptoms do not favor nerve pain.  If she develops numbness/tingling of the foot, she will contact my office and I will order NCS/EMG of the foot.  Refer to Sports Medicine for evaluation.   Follow Up Instructions:  I discussed the assessment and treatment  plan with the patient. The patient was provided an opportunity to ask questions and all were answered. The patient agreed with the plan and demonstrated an understanding of the instructions.   The patient was advised to call back or seek an in-person evaluation if the symptoms worsen or if the condition fails to improve as anticipated.   [place]ale Chardonika K , DO

## 2018-12-04 ENCOUNTER — Telehealth (INDEPENDENT_AMBULATORY_CARE_PROVIDER_SITE_OTHER): Payer: BC Managed Care – PPO | Admitting: Neurology

## 2018-12-04 ENCOUNTER — Other Ambulatory Visit: Payer: Self-pay

## 2018-12-04 ENCOUNTER — Encounter: Payer: Self-pay | Admitting: *Deleted

## 2018-12-04 ENCOUNTER — Encounter

## 2018-12-04 ENCOUNTER — Encounter: Payer: Self-pay | Admitting: Neurology

## 2018-12-04 VITALS — Ht 71.0 in | Wt 192.0 lb

## 2018-12-04 DIAGNOSIS — M79671 Pain in right foot: Secondary | ICD-10-CM | POA: Diagnosis not present

## 2018-12-04 DIAGNOSIS — G43009 Migraine without aura, not intractable, without status migrainosus: Secondary | ICD-10-CM

## 2018-12-05 ENCOUNTER — Other Ambulatory Visit: Payer: Self-pay | Admitting: *Deleted

## 2018-12-05 DIAGNOSIS — M79671 Pain in right foot: Secondary | ICD-10-CM

## 2018-12-05 NOTE — Progress Notes (Signed)
Referral sent 

## 2018-12-22 ENCOUNTER — Other Ambulatory Visit: Payer: Self-pay

## 2018-12-22 ENCOUNTER — Ambulatory Visit (INDEPENDENT_AMBULATORY_CARE_PROVIDER_SITE_OTHER)
Admission: RE | Admit: 2018-12-22 | Discharge: 2018-12-22 | Disposition: A | Discharge status: Home / Self Care | Payer: BC Managed Care – PPO | Source: Ambulatory Visit | Attending: Family Medicine | Admitting: Family Medicine

## 2018-12-22 ENCOUNTER — Ambulatory Visit: Payer: BC Managed Care – PPO | Admitting: Family Medicine

## 2018-12-22 ENCOUNTER — Encounter: Payer: Self-pay | Admitting: Family Medicine

## 2018-12-22 ENCOUNTER — Ambulatory Visit: Payer: Self-pay

## 2018-12-22 VITALS — BP 120/78 | HR 71 | Ht 71.0 in | Wt 199.0 lb

## 2018-12-22 DIAGNOSIS — M2142 Flat foot [pes planus] (acquired), left foot: Secondary | ICD-10-CM | POA: Diagnosis not present

## 2018-12-22 DIAGNOSIS — M79671 Pain in right foot: Secondary | ICD-10-CM

## 2018-12-22 DIAGNOSIS — M2141 Flat foot [pes planus] (acquired), right foot: Secondary | ICD-10-CM

## 2018-12-22 DIAGNOSIS — M25571 Pain in right ankle and joints of right foot: Secondary | ICD-10-CM | POA: Diagnosis not present

## 2018-12-22 DIAGNOSIS — M2140 Flat foot [pes planus] (acquired), unspecified foot: Secondary | ICD-10-CM | POA: Insufficient documentation

## 2018-12-22 NOTE — Assessment & Plan Note (Addendum)
Patient does have significant pes planus.  I think this contributes.  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  Ultrasound did not show anything significant other than possible narrowing of the joint space.  No significant arthritic changes.  Discussed with patient about icing regimen and home exercises.  We discussed avoiding being barefoot, home exercises given and patient will both athletic trainer.  Patient will increase activity slowly.  Follow-up again 4 weeks

## 2018-12-22 NOTE — Progress Notes (Signed)
[name] [address] [place], [place] [address] Phone: [phone] Subjective:   Bruce Donath, am serving as a scribe for Dr. Antoine Primas.  I'm seeing this patient by the request  of:  Deeann Saint, MD   CC: Foot pain  [id number]  Lori Vaughn is a 33 y.o. female coming in with complaint of right foot pain. Patient states that she has had pain for 6 months. Pain radiates from ATF ligament all the way up her body through her shoulder. Pain occurs with dorsiflexion and trunk flexion. Pain lasts for just a few seconds. Has seen neurology. Patient notes that drinking a lot of wine will trigger the pain.     Past Medical History:  Diagnosis Date  . Amenorrhea 01/28/2017  . Amenorrhea, secondary 01/28/2017  . Bacterial vaginitis 01/28/2017  . Elevated serum hCG 01/28/2017   HCG 26 on 01/28/17  . Migraines   . PCOS (polycystic ovarian syndrome)   . PCOS (polycystic ovarian syndrome)    Past Surgical History:  Procedure Laterality Date  . NASAL SINUS SURGERY     Social History   Socioeconomic History  . Marital status: Single    Spouse name: Not on file  . Number of children: Not on file  . Years of education: Not on file  . Highest education level: Not on file  Occupational History  . Not on file  Social Needs  . Financial resource strain: Not on file  . Food insecurity:    Worry: Not on file    Inability: Not on file  . Transportation needs:    Medical: Not on file    Non-medical: Not on file  Tobacco Use  . Smoking status: Never Smoker  . Smokeless tobacco: Never Used  Substance and Sexual Activity  . Alcohol use: Yes    Comment: Occasionally  . Drug use: Yes    Types: Marijuana  . Sexual activity: Yes    Birth control/protection: None  Lifestyle  . Physical activity:    Days per week: Not on file    Minutes per session: Not on file  . Stress: Not on file  Relationships  . Social connections:   Talks on phone: Not on file    Gets together: Not on file    Attends religious service: Not on file    Active member of club or organization: Not on file    Attends meetings of clubs or organizations: Not on file    Relationship status: Not on file  Other Topics Concern  . 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   Highest level of education:  Will be completing doctorate in [employer]   She lives with boyfriend and his cousin.    Allergies  Allergen Reactions  . Molds & Smuts Other (See Comments)    Nasal polyps   Family History  Problem Relation Age of Onset  . Diabetes Father   . Hypertension Father   . Cystic fibrosis Father   . Diabetes Maternal Grandmother   . Diabetes Paternal Grandmother   . Bipolar disorder Mother   . Depression Mother   . Healthy Sister          Current Outpatient Medications (Other):  .  penicillin v potassium (VEETID) 500 MG tablet,     Past medical history, social, surgical and family history all reviewed in electronic medical record.  No pertanent information unless stated regarding to the chief complaint.   Review of Systems:  No headache, visual changes, nausea, vomiting, diarrhea, constipation, dizziness, abdominal pain, skin rash, fevers, chills, night sweats, weight loss, swollen lymph nodes, body aches, joint swelling, muscle aches, chest pain, shortness of breath, mood changes.   Objective  Blood pressure 120/78, pulse 71, height 5\' 11"  (1.803 m), weight 199 lb (90.3 kg), SpO2 97 %, unknown if currently breastfeeding.    General: No apparent distress alert and oriented x3 mood and affect normal, dressed appropriately.  HEENT: Pupils equal, extraocular movements intact  Respiratory: Patient's speak in full sentences and does not appear short of breath  Cardiovascular: No lower extremity edema, non tender, no erythema  Skin: Warm dry intact with no signs of  infection or rash on extremities or on axial skeleton.  Abdomen: Soft nontender  Neuro: Cranial nerves II through XII are intact, neurovascularly intact in all extremities with 2+ DTRs and 2+ pulses.  Lymph: No lymphadenopathy of posterior or anterior cervical chain or axillae bilaterally.  Gait normal with good balance and coordination.  MSK:  Non tender with full range of motion and good stability and symmetric strength and tone of shoulders, elbows, wrist, hip, knee and ankles bilaterally.  Right foot exam shows the patient does have significant pes planus.  Patient does have a narrow foot.  Patient does have full range of motion of the foot but does seem to have unfortunately some mild impingement with dorsiflexion on the lateral aspect.  Some mild swelling over the sinus tarsi.  Limited musculoskeletal ultrasound was performed interpreted by Judi SaaZachary M   Limited ultrasound of patient's foot shows the patient does have some mild narrowing of the sinus tarsi.  Otherwise unremarkable  [id number]; 15 additional minutes spent for Therapeutic exercises as stated in above notes.  This included exercises focusing on stretching, strengthening, with significant focus on eccentric aspects.   Long term goals include an improvement in range of motion, strength, endurance as well as avoiding reinjury. Patient's frequency would include in 1-2 times a day, 3-5 times a week for a duration of 6-12 weeks. Exercises for the foot include:  Stretches to help lengthen the lower leg and plantar fascia areas Theraband exercises for the lower leg and ankle to help strengthen the surrounding area- dorsiflexion, plantarflexion, inversion, eversion Massage rolling on the plantar surface of the foot with a frozen bottle, tennis ball or golf ball Towel or marble pick-ups to strengthen the plantar surface of the foot Weight bearing exercises to increase balance and overall stability   Proper technique shown and discussed  handout in great detail with ATC.  All questions were discussed and answered.     Impression and Recommendations:     This case required medical decision making of moderate complexity. The above documentation has been reviewed and is accurate and complete Judi SaaZachary M , DO       Note: This dictation was prepared with Dragon dictation along with smaller phrase technology. Any transcriptional errors that result from this process are unintentional.

## 2018-12-22 NOTE — Patient Instructions (Signed)
Good to see you  Ice 20 minutes 2 times daily. Usually after activity and before bed. Sinus tarsi syndrome.  Exercises 3 times a week.   [employer] orthotics "total support" online would be great  Good shoes with rigid bottom.  Lori Vaughn, [place] or New balance greater then 700 This should help your feet, knees and back  See me again in 6 weeks

## 2018-12-22 NOTE — Assessment & Plan Note (Signed)
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  No significant weakness noted.  Significant breakdown of the foot that is more of a concerning aspect.  X-rays ordered today to further evaluate for anything such as an office navicular or anything else that would contribute to it.  Neck x-rays also ordered today but likely will be unremarkable.  Patient will make the changes in shoes and over-the-counter orthotics.  We will hold on any type of medication changes with patient trying to get pregnant.  Follow-up again in 4 to 8 weeks

## 2018-12-26 ENCOUNTER — Telehealth: Payer: Self-pay | Admitting: *Deleted

## 2018-12-26 NOTE — Telephone Encounter (Signed)
Copied from CRM [id number]. Topic: General - Other >> Dec 26, 2018  2:39 PM Marylen Ponto wrote: Reason for CRM: Pt called for x-ray results. Pt requests call back.

## 2018-12-27 ENCOUNTER — Encounter: Payer: Self-pay | Admitting: Family Medicine

## 2018-12-28 NOTE — Telephone Encounter (Signed)
Pt was given x-ray results by Dr Katrinka Blazing

## 2019-01-05 ENCOUNTER — Ambulatory Visit: Payer: BC Managed Care – PPO | Admitting: Neurology

## 2019-02-04 NOTE — Progress Notes (Deleted)
Lori ScaleZach Danna Casella D.O. [facility] [address] [place], [place] [address] Phone: [phone] Subjective:    I'm seeing this patient by the request  of:    CC: Right ankle pain follow-up  BJY:[id number]:Subjective  Lori Vaughn is a 33 y.o. female coming in with complaint of ***  Onset-  Location Duration-  Character- Aggravating factors- Reliving factors-  Therapies tried-  Severity-     Past Medical History:  Diagnosis Date  . Amenorrhea 01/28/2017  . Amenorrhea, secondary 01/28/2017  . Bacterial vaginitis 01/28/2017  . Elevated serum hCG 01/28/2017   HCG 26 on 01/28/17  . Migraines   . PCOS (polycystic ovarian syndrome)   . PCOS (polycystic ovarian syndrome)    Past Surgical History:  Procedure Laterality Date  . NASAL SINUS SURGERY     Social History   Socioeconomic History  . Marital status: Single    Spouse name: Not on file  . Number of children: Not on file  . Years of education: Not on file  . Highest education level: Not on file  Occupational History  . Not on file  Social Needs  . Financial resource strain: Not on file  . Food insecurity    Worry: Not on file    Inability: Not on file  . Transportation needs    Medical: Not on file    Non-medical: Not on file  Tobacco Use  . Smoking status: Never Smoker  . Smokeless tobacco: Never Used  Substance and Sexual Activity  . Alcohol use: Yes    Comment: Occasionally  . Drug use: Yes    Types: Marijuana  . Sexual activity: Yes    Birth control/protection: None  Lifestyle  . Physical activity    Days per week: Not on file    Minutes per session: Not on file  . Stress: Not on file  Relationships  . Social Musicianconnections    Talks on phone: Not on file    Gets together: Not on file    Attends religious service: Not on file    Active member of club or organization: Not on file    Attends meetings of clubs or organizations: Not on file    Relationship status: Not on file  Other Topics  Concern  . 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   Highest level of education:  Will be completing doctorate in [employer]   She lives with boyfriend and his cousin.    Allergies  Allergen Reactions  . Molds & Smuts Other (See Comments)    Nasal polyps   Family History  Problem Relation Age of Onset  . Diabetes Father   . Hypertension Father   . Cystic fibrosis Father   . Diabetes Maternal Grandmother   . Diabetes Paternal Grandmother   . Bipolar disorder Mother   . Depression Mother   . Healthy Sister          Current Outpatient Medications (Other):  .  penicillin v potassium (VEETID) 500 MG tablet,     Past medical history, social, surgical and family history all reviewed in electronic medical record.  No pertanent information unless stated regarding to the chief complaint.   Review of Systems:  No headache, visual changes, nausea, vomiting, diarrhea, constipation, dizziness, abdominal pain, skin rash, fevers, chills, night sweats, weight loss, swollen lymph nodes, body aches, joint swelling, muscle aches, chest pain, shortness of breath, mood  changes.   Objective  unknown if currently breastfeeding. Systems examined below as of    General: No apparent distress alert and oriented x3 mood and affect normal, dressed appropriately.  HEENT: Pupils equal, extraocular movements intact  Respiratory: Patient's speak in full sentences and does not appear short of breath  Cardiovascular: No lower extremity edema, non tender, no erythema  Skin: Warm dry intact with no signs of infection or rash on extremities or on axial skeleton.  Abdomen: Soft nontender  Neuro: Cranial nerves II through XII are intact, neurovascularly intact in all extremities with 2+ DTRs and 2+ pulses.  Lymph: No lymphadenopathy of posterior or anterior cervical chain or axillae bilaterally.  Gait normal with good balance and  coordination.  MSK:  Non tender with full range of motion and good stability and symmetric strength and tone of shoulders, elbows, wrist, hip, knee bilaterally.  Ankle: No visible erythema or swelling. Range of motion is full in all directions. Strength is 5/5 in all directions. Stable lateral and medial ligaments; squeeze test and kleiger test unremarkable; Talar dome nontender; No pain at base of 5th MT; No tenderness over cuboid; No tenderness over N spot or navicular prominence No tenderness on posterior aspects of lateral and medial malleolus No sign of peroneal tendon subluxations or tenderness to palpation Negative tarsal tunnel tinel's Able to walk 4 steps.   Impression and Recommendations:     This case required medical decision making of moderate complexity. The above documentation has been reviewed and is accurate and complete Lyndal Pulley, DO       Note: This dictation was prepared with Dragon dictation along with smaller phrase technology. Any transcriptional errors that result from this process are unintentional.

## 2019-02-05 ENCOUNTER — Ambulatory Visit: Payer: BC Managed Care – PPO | Admitting: Family Medicine

## 2019-02-26 ENCOUNTER — Other Ambulatory Visit: Payer: Self-pay

## 2019-02-26 ENCOUNTER — Ambulatory Visit (INDEPENDENT_AMBULATORY_CARE_PROVIDER_SITE_OTHER): Payer: BC Managed Care – PPO | Admitting: Family Medicine

## 2019-02-26 ENCOUNTER — Encounter: Payer: Self-pay | Admitting: Family Medicine

## 2019-02-26 DIAGNOSIS — D573 Sickle-cell trait: Secondary | ICD-10-CM

## 2019-02-26 DIAGNOSIS — Z3A13 13 weeks gestation of pregnancy: Secondary | ICD-10-CM | POA: Diagnosis not present

## 2019-02-26 DIAGNOSIS — L819 Disorder of pigmentation, unspecified: Secondary | ICD-10-CM

## 2019-02-26 NOTE — Progress Notes (Signed)
Virtual Visit via Video Note  I connected with Lori Vaughn on 02/26/19 at  3:30 PM EDT by a video enabled telemedicine application and verified that I am speaking with the correct person using two identifiers.  Location patient: home Location provider:work or home office Persons participating in the virtual visit: patient, provider  I discussed the limitations of evaluation and management by telemedicine and the availability of in person appointments. The patient expressed understanding and agreed to proceed.   HPI: Pt states she recently found out she is pregnant.   Pt is a G2P0010 around 13 wks.  EDD is Sep 03, 2019.   [facility].  Told she has sickle cell trait.  Taking PNV.  States her partner, Octaviano Battyerrance Walker (also seen by this provider), had positive sickle cell screening.  Pt inquires about what that means for her child.  Pt states she has not seen a Dentistgenetic counselor nor received the remaining genetic testing results from her OB/Gyn.  Pt's dad has sickle cell trait. States lupus runs in the family. Inquires about testing for lupus. Pt is taking PNV.  State feels good overall.  Notes being tired more. Occasionally has pain in legs.  Denies n/v.  Developed a rash x 1 month.  Hyperpigmented, pruritic, flaky areas on L shoulder and underneath breast.  Pt states her OB/Gyn did not feel they were related to pregnancy.  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  ROS: See pertinent positives and negatives per HPI.  Past Medical History:  Diagnosis Date  . Amenorrhea 01/28/2017  . Amenorrhea, secondary 01/28/2017  . Bacterial vaginitis 01/28/2017  . Elevated serum hCG 01/28/2017   HCG 26 on 01/28/17  . Migraines   . PCOS (polycystic ovarian syndrome)   . PCOS (polycystic ovarian syndrome)     Past Surgical History:  Procedure Laterality Date  . NASAL SINUS SURGERY      Family History  Problem  Relation Age of Onset  . Diabetes Father   . Hypertension Father   . Cystic fibrosis Father   . Diabetes Maternal Grandmother   . Diabetes Paternal Grandmother   . Bipolar disorder Mother   . Depression Mother   . Healthy Sister      Current Outpatient Medications:  .  penicillin v potassium (VEETID) 500 MG tablet, , Disp: , Rfl:   EXAM:  VITALS per patient if applicable: RR between 12-20 bpm.  GENERAL: alert, oriented, appears well and in no acute distress  HEENT: atraumatic, conjunctiva clear, no obvious abnormalities on inspection of external nose and ears  NECK: normal movements of the head and neck  LUNGS: on inspection no signs of respiratory distress, breathing rate appears normal, no obvious gross SOB, gasping or wheezing  CV: no obvious cyanosis  SKIN:  3 Hyperpigmented macules on L anterior and posterior shoulder and underneath L breast.    MS: moves all visible extremities without noticeable abnormality  PSYCH/NEURO: pleasant and cooperative, no obvious depression or anxiety, speech and thought processing grossly intact  ASSESSMENT AND PLAN:  Discussed the following assessment and plan:  [redacted] weeks gestation of pregnancy  -Pt encouraged to keep OB/Gyn appts -ok to use tylenol prn for pain/discomfort -continue PNV  Hyperpigmented skin lesion -discussed various treatment options including selsan blue shampoo and a few days of topical OTC cortisone cream. - Plan: Ambulatory referral to Dermatology  Sickle Cell Trait -pt would benefit from genetic counselor consult -discussed punnett  squares and chance pt's unborn child would have sickle cell dz if FOB has sickle cell trait. -questions answered to satisfaction.  F/u prn with OB/Gyn   I discussed the assessment and treatment plan with the patient. The patient was provided an opportunity to ask questions and all were answered. The patient agreed with the plan and demonstrated an understanding of the  instructions.   The patient was advised to call back or seek an in-person evaluation if the symptoms worsen or if the condition fails to improve as anticipated.   Billie Ruddy, MD

## 2019-07-30 ENCOUNTER — Telehealth (INDEPENDENT_AMBULATORY_CARE_PROVIDER_SITE_OTHER): Payer: BC Managed Care – PPO | Admitting: Family Medicine

## 2019-07-30 DIAGNOSIS — Z20828 Contact with and (suspected) exposure to other viral communicable diseases: Secondary | ICD-10-CM | POA: Diagnosis not present

## 2019-07-30 DIAGNOSIS — Z3A35 35 weeks gestation of pregnancy: Secondary | ICD-10-CM | POA: Diagnosis not present

## 2019-07-30 DIAGNOSIS — Z20822 Contact with and (suspected) exposure to covid-19: Secondary | ICD-10-CM

## 2019-07-30 NOTE — Progress Notes (Signed)
Virtual Visit via Video Note  I connected with Lori Vaughn on 07/30/19 at  1:30 PM EST by a video enabled telemedicine application 2/2 [id number] pandemic and verified that I am speaking with the correct person using two identifiers.  Location patient: home Location provider:work or home office Persons participating in the virtual visit: patient, provider  I discussed the limitations of evaluation and management by telemedicine and the availability of in person appointments. The patient expressed understanding and agreed to proceed.   HPI: Pt is a 33 yo G2P0010 at 35 wks with pmh sig for sickle cell trait, PCOS, h/o migraine.  Pt inquires about COVID testing.  Pt's mother tested positive for COVID-19 a few days ago and is symptomatic.  Pt was around her mother last wk.  Pt asymptomatic.  ROS: See pertinent positives and negatives per HPI.  Past Medical History:  Diagnosis Date  . Amenorrhea 01/28/2017  . Amenorrhea, secondary 01/28/2017  . Bacterial vaginitis 01/28/2017  . Elevated serum hCG 01/28/2017   HCG 26 on 01/28/17  . Migraines   . PCOS (polycystic ovarian syndrome)   . PCOS (polycystic ovarian syndrome)     Past Surgical History:  Procedure Laterality Date  . NASAL SINUS SURGERY      Family History  Problem Relation Age of Onset  . Diabetes Father   . Hypertension Father   . Cystic fibrosis Father   . Sickle cell trait Father   . Diabetes Maternal Grandmother   . Diabetes Paternal Grandmother   . Bipolar disorder Mother   . Depression Mother   . Healthy Sister     Current Outpatient Medications:  .  penicillin v potassium (VEETID) 500 MG tablet, , Disp: , Rfl:   EXAM:  VITALS per patient if applicable:  RR between 12-20 bpm  GENERAL: alert, oriented, appears well and in no acute distress  HEENT: atraumatic, conjunctiva clear, no obvious abnormalities on inspection of external nose and ears  NECK: normal movements of the head and neck  LUNGS: on  inspection no signs of respiratory distress, breathing rate appears normal, no obvious gross SOB, gasping or wheezing  CV: no obvious cyanosis  ABD: gravid  MS: moves all visible extremities without noticeable abnormality  PSYCH/NEURO: pleasant and cooperative, no obvious depression or anxiety, speech and thought processing grossly intact  ASSESSMENT AND PLAN:  Discussed the following assessment and plan:  Close exposure to COVID-19 virus -pt currently asymptomatic -given info on area testing sites. -discussed s/s of COVID-19 -given precautions  [redacted] weeks gestation of pregnancy -continue f/u with OB/Gyn  F/u prn   I discussed the assessment and treatment plan with the patient. The patient was provided an opportunity to ask questions and all were answered. The patient agreed with the plan and demonstrated an understanding of the instructions.   The patient was advised to call back or seek an in-person evaluation if the symptoms worsen or if the condition fails to improve as anticipated.   Billie Ruddy, MD

## 2019-07-31 ENCOUNTER — Ambulatory Visit: Payer: BC Managed Care – PPO | Attending: Internal Medicine

## 2019-07-31 DIAGNOSIS — Z20822 Contact with and (suspected) exposure to covid-19: Secondary | ICD-10-CM

## 2019-08-02 LAB — NOVEL CORONAVIRUS, NAA: SARS-CoV-2, NAA: NOT DETECTED

## 2019-11-24 IMAGING — DX RIGHT FOOT COMPLETE - 3+ VIEW
3 series · 3 of 3 positions shown · non-contrast
Comparison: None.

CLINICAL DATA: Right foot and ankle pain.

EXAM:
RIGHT FOOT COMPLETE - 3+ VIEW

[foot ap]
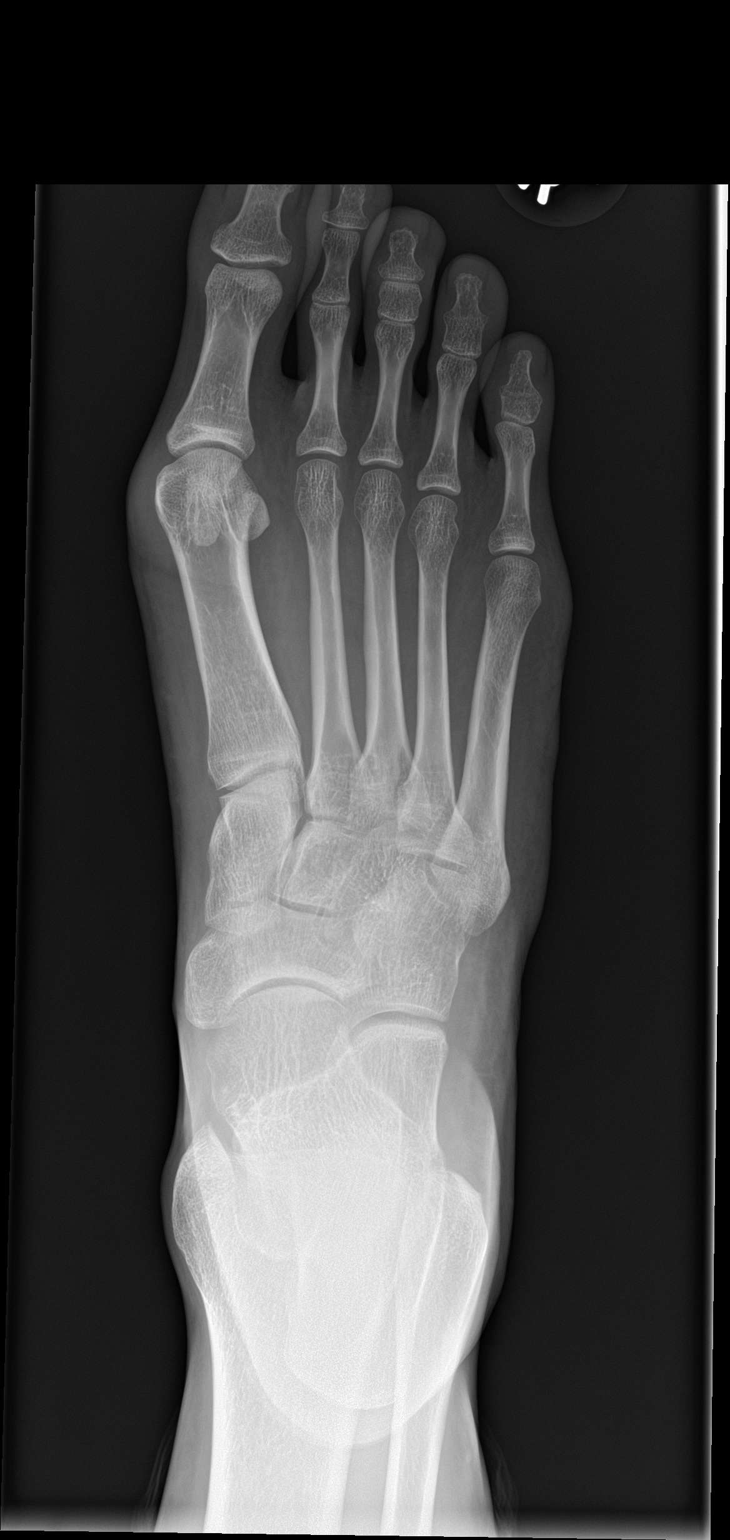

[foot obl]
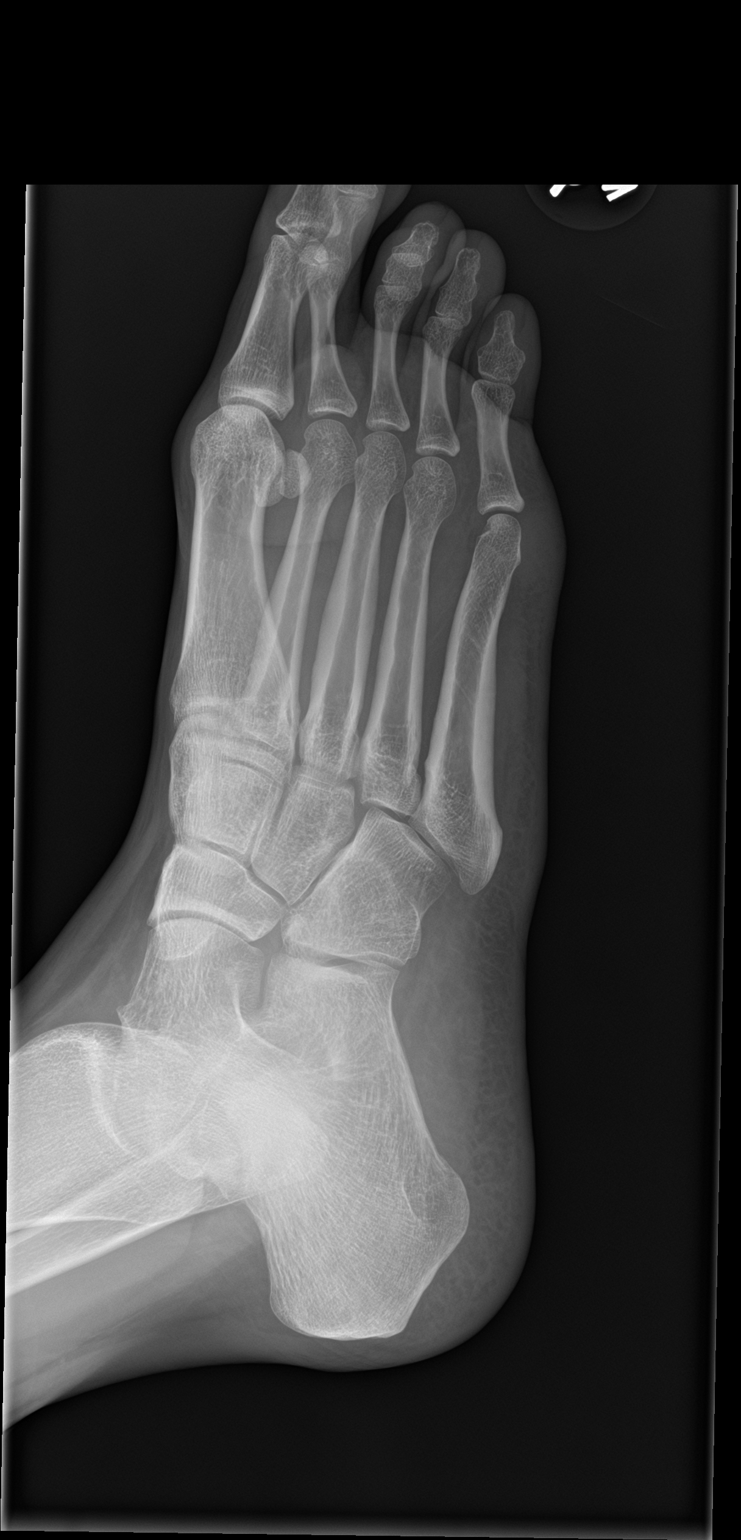

[foot lat]
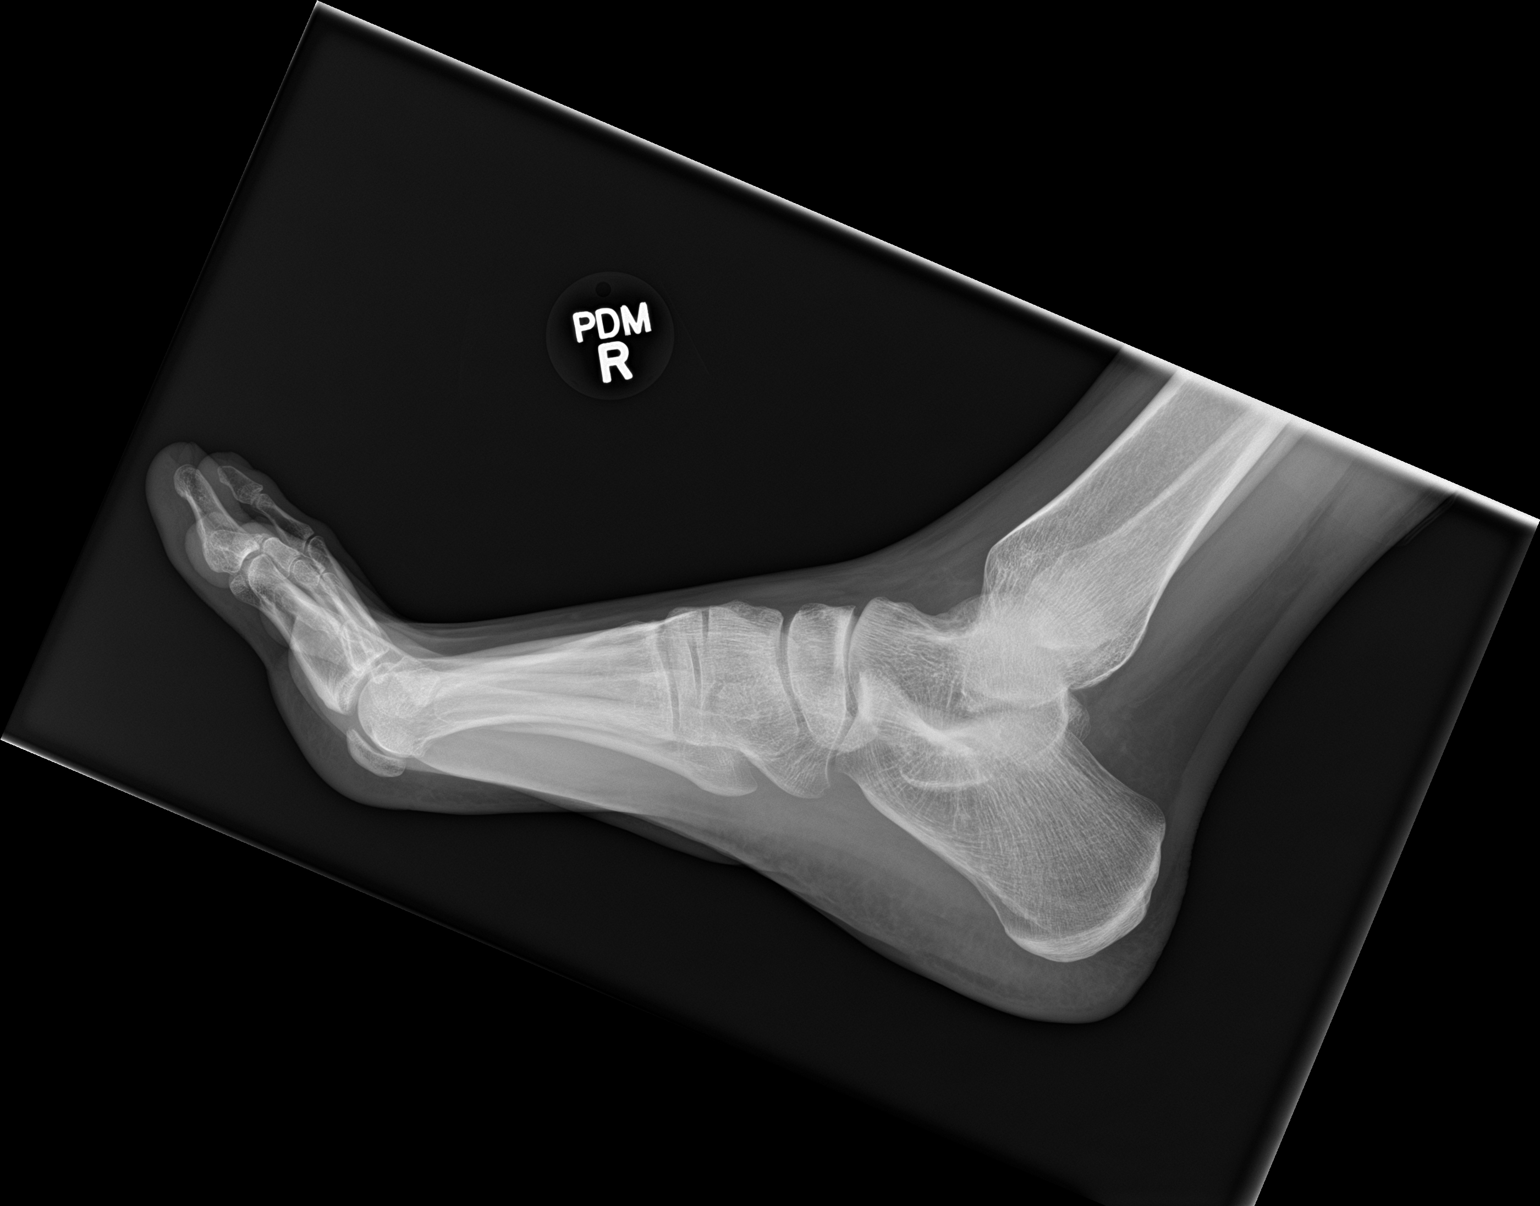

[3 of 3 positions shown; findings below may reference images not displayed]

FINDINGS: Mild hallux valgus. There is no evidence of fracture or dislocation.
There is no evidence of arthropathy or other focal bone abnormality.
Soft tissues are unremarkable.
IMPRESSION: Mild hallux valgus.

## 2019-11-24 IMAGING — DX LUMBAR SPINE - 2-3 VIEW
3 series · 3 of 3 positions shown · non-contrast
Comparison: None.

CLINICAL DATA: Lumbar radiculopathy. Low back pain for 6 months. No
known injury.

EXAM:
LUMBAR SPINE - 2-3 VIEW

[l-spine ap]
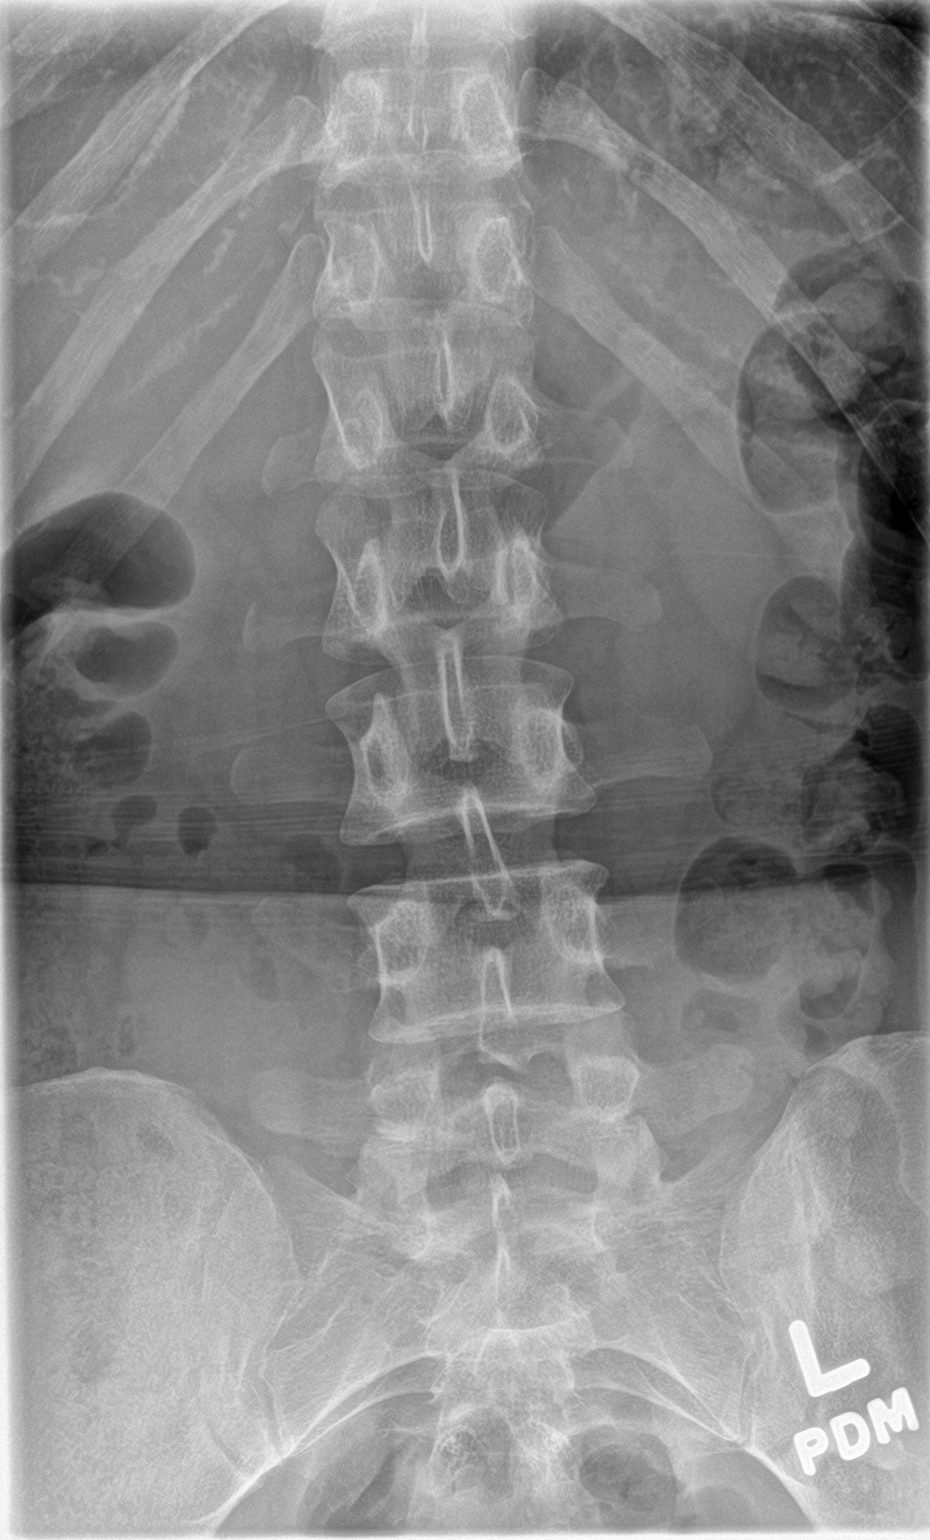

[l-spine lat]
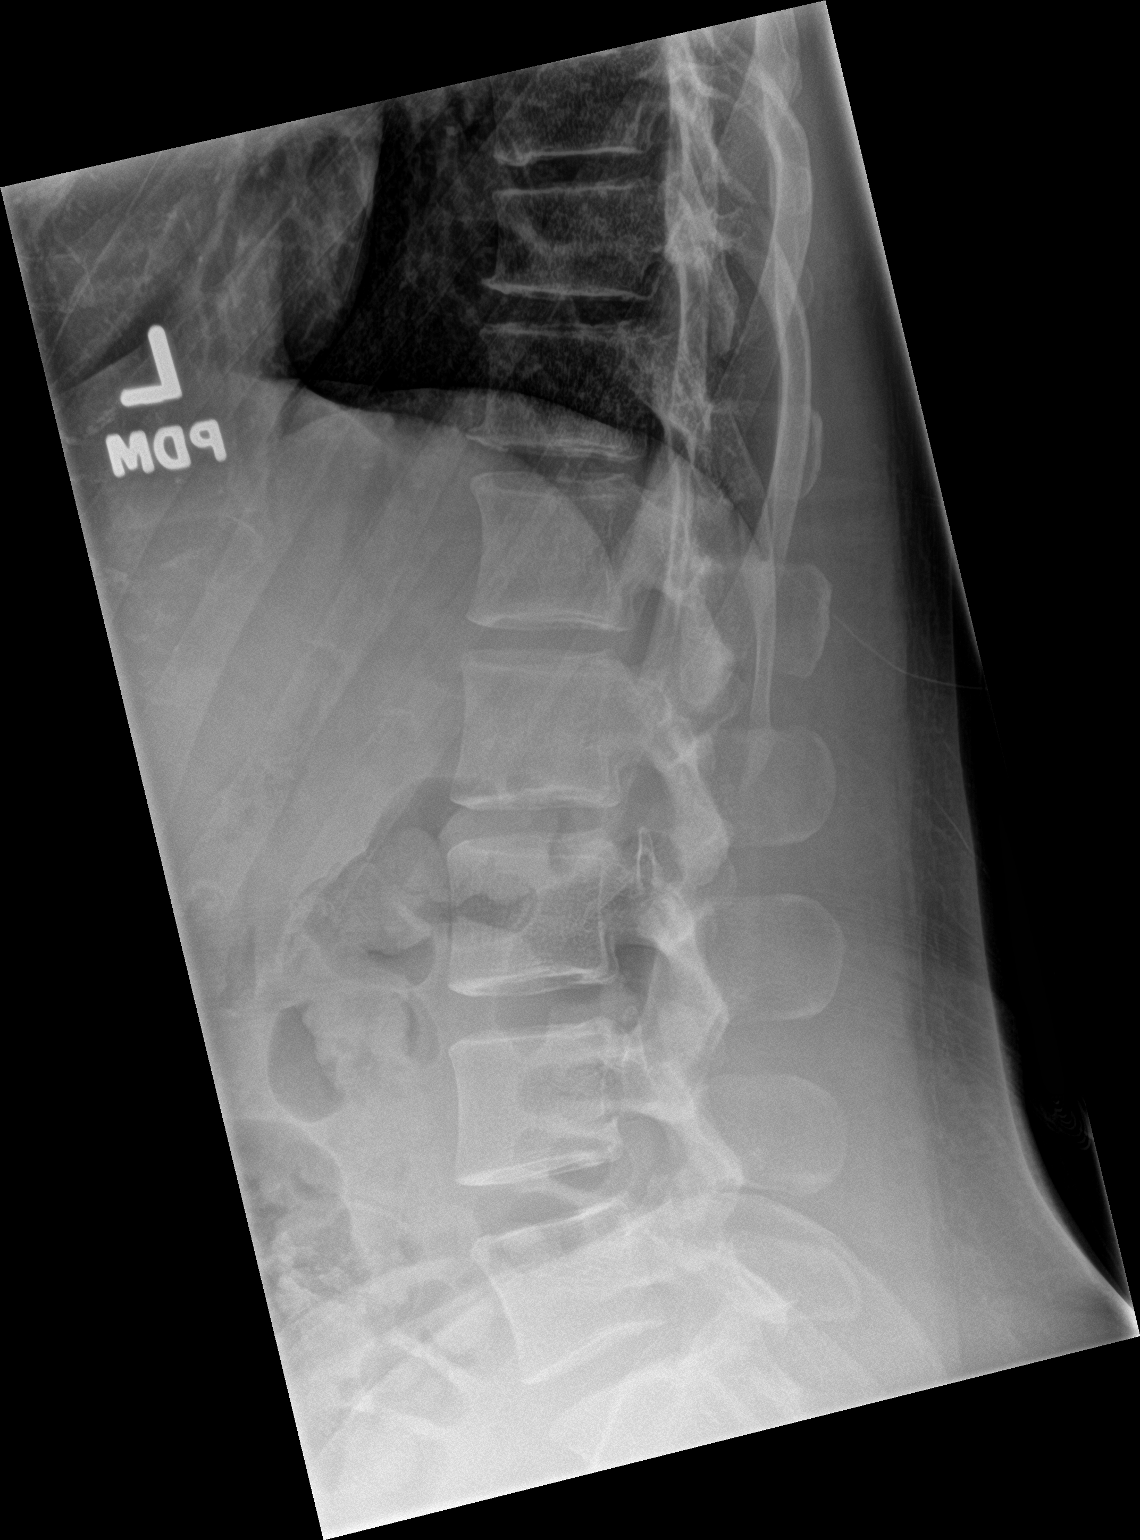

[l-spine spot]
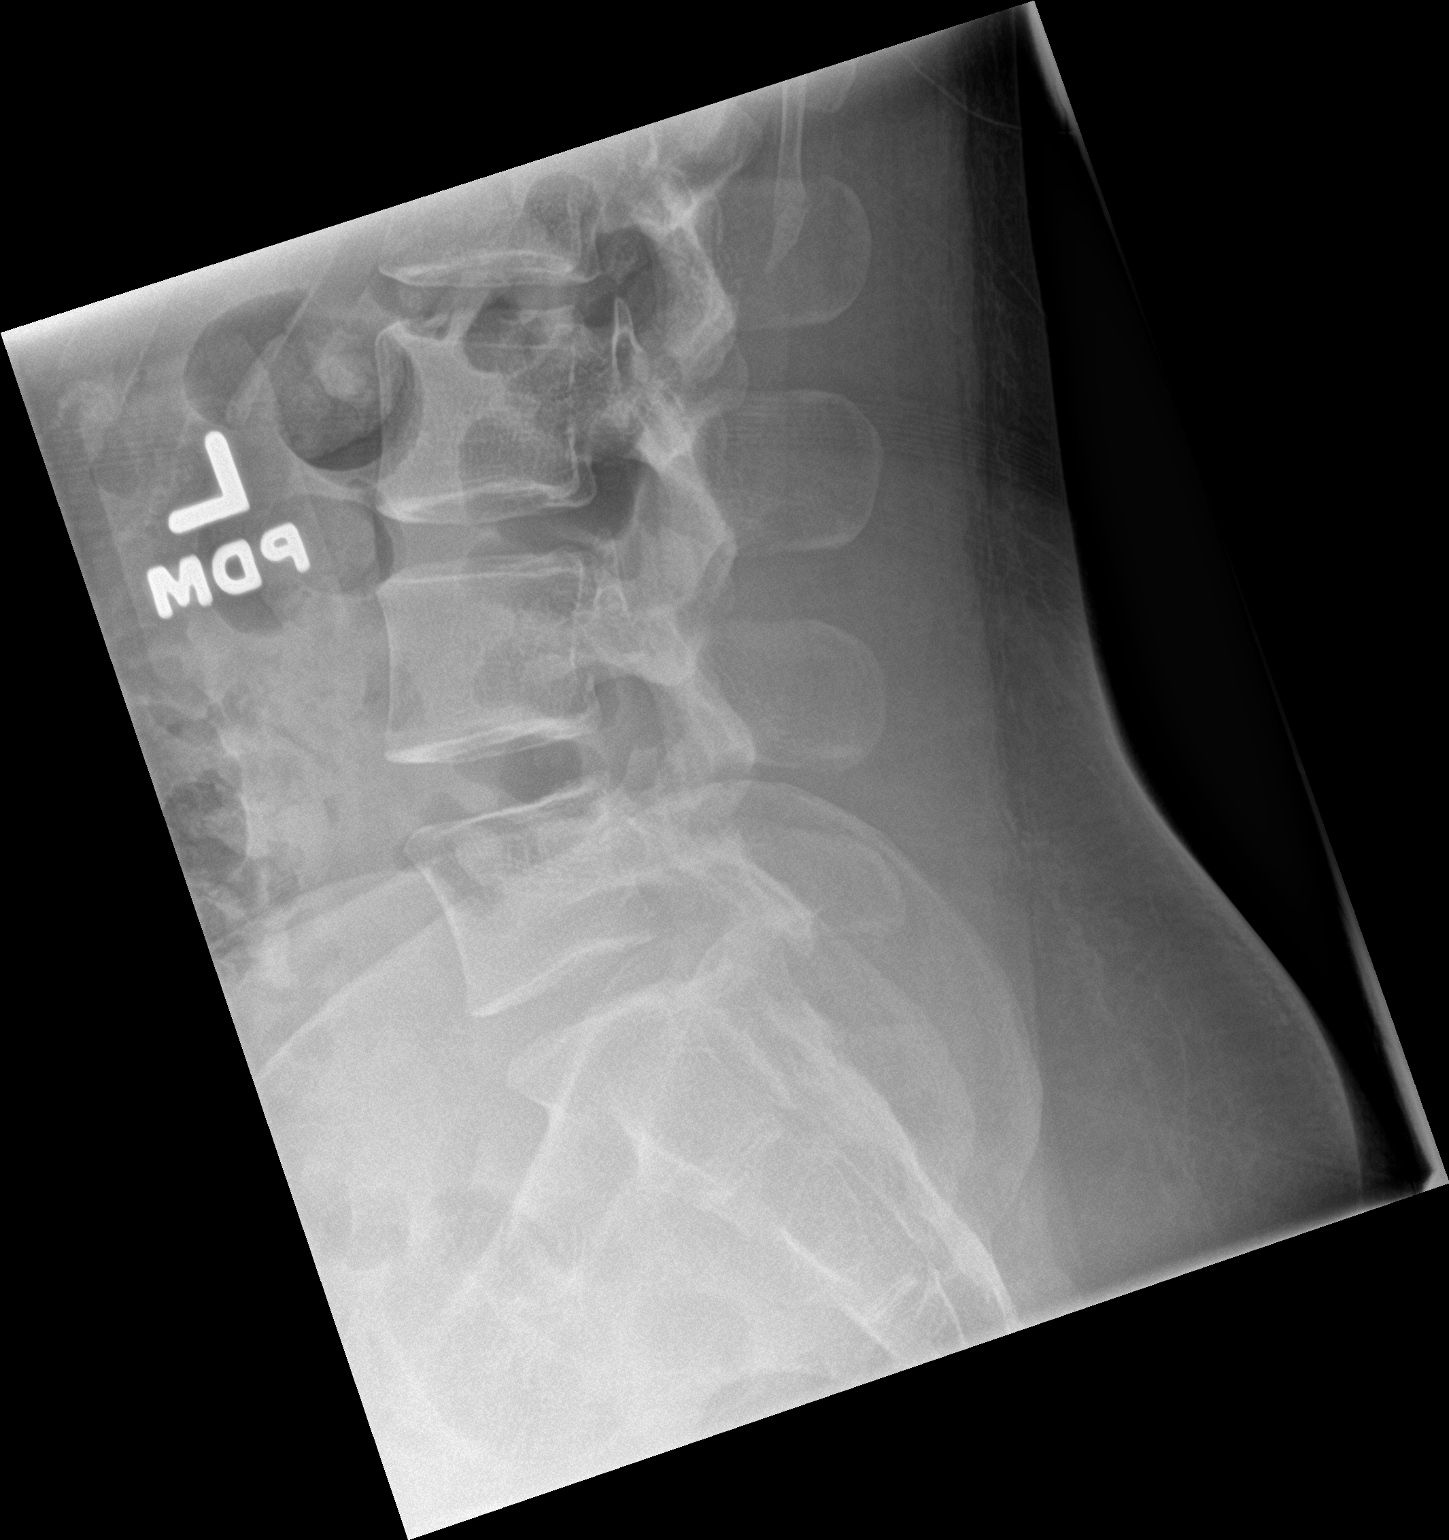

[3 of 3 positions shown; findings below may reference images not displayed]

FINDINGS: The alignment is maintained. Vertebral body heights are normal.
There is no listhesis. The posterior elements are intact. Disc
spaces are preserved. No fracture. Sacroiliac joints are symmetric
and normal.
IMPRESSION: Negative radiographs of the lumbar spine.

## 2020-01-29 ENCOUNTER — Telehealth (INDEPENDENT_AMBULATORY_CARE_PROVIDER_SITE_OTHER): Payer: BC Managed Care – PPO | Admitting: Family Medicine

## 2020-01-29 DIAGNOSIS — R0981 Nasal congestion: Secondary | ICD-10-CM

## 2020-01-29 NOTE — Progress Notes (Addendum)
Virtual Visit via Video Note  I connected with Lori Vaughn  on 01/29/20 at 12:20 PM EDT by a video enabled telemedicine application and verified that I am speaking with the correct person using two identifiers.  Location patient: home, [place] Location provider:work or home office Persons participating in the virtual visit: patient, provider, daughter  I discussed the limitations of evaluation and management by telemedicine and the availability of in person appointments. The patient expressed understanding and agreed to proceed.   HPI:  Acute visit for sinus issues: -started about 1 week ago, starting to feel a little better today -symptoms include sinus congestion, sinus discomfort, feels more tired than usual, temp 98.2, body aches, chills intermittent, sneezing -denies cough, SOB, NVD -daughter has a cold, she tested negative for covid -not vaccinated for COVID19  ROS: See pertinent positives and negatives per HPI.  Past Medical History:  Diagnosis Date  . Amenorrhea 01/28/2017  . Amenorrhea, secondary 01/28/2017  . Bacterial vaginitis 01/28/2017  . Elevated serum hCG 01/28/2017   HCG 26 on 01/28/17  . Migraines   . PCOS (polycystic ovarian syndrome)   . PCOS (polycystic ovarian syndrome)     Past Surgical History:  Procedure Laterality Date  . NASAL SINUS SURGERY      Family History  Problem Relation Age of Onset  . Diabetes Father   . Hypertension Father   . Cystic fibrosis Father   . Sickle cell trait Father   . Diabetes Maternal Grandmother   . Diabetes Paternal Grandmother   . Bipolar disorder Mother   . Depression Mother   . Healthy Sister     SOCIAL HX: see hpi   Current Outpatient Medications:  .  penicillin v potassium (VEETID) 500 MG tablet, , Disp: , Rfl:   EXAM:  VITALS per patient if applicable:  GENERAL: alert, oriented, appears well and in no acute distress  HEENT: atraumatic, conjunttiva clear, no obvious abnormalities on inspection of external  nose and ears  NECK: normal movements of the head and neck  LUNGS: on inspection no signs of respiratory distress, breathing rate appears normal, no obvious gross SOB, gasping or wheezing  CV: no obvious cyanosis  MS: moves all visible extremities without noticeable abnormality  PSYCH/NEURO: pleasant and cooperative, no obvious depression or anxiety, speech and thought processing grossly intact  ASSESSMENT AND PLAN:  Discussed the following assessment and plan:  Nasal congestion  -we discussed possible serious and likely etiologies, options for evaluation and workup, limitations of telemedicine visit vs in person visit, treatment, treatment risks and precautions. Pt prefers to treat via telemedicine empirically rather then risking or undertaking an in person visit at this moment. Suspect VURI, likely what daughter has. Discussed symptomatic care, possibility of COVID19, COVID19 testing and vaccine. Discussed OTC options such as nasal decongestant, nasal saline and analgesics. Patient agrees to seek prompt in person care if worsening, new symptoms arise, or if is not improving with treatment.   I discussed the assessment and treatment plan with the patient. The patient was provided an opportunity to ask questions and all were answered. The patient agreed with the plan and demonstrated an understanding of the instructions.   The patient was advised to call back or seek an in-person evaluation if the symptoms worsen or if the condition fails to improve as anticipated.   Terressa Koyanagi, DO

## 2020-01-29 NOTE — Patient Instructions (Signed)
-  nasal saline twice daily  -consider covid 19 testing as we discussed, stay home when feeling sick  I hope you are feeling better soon! Seek care promptly if your symptoms worsen, new concerns arise or you are not improving with treatment.

## 2020-02-13 ENCOUNTER — Ambulatory Visit: Payer: Self-pay | Admitting: Family Medicine

## 2020-02-18 ENCOUNTER — Ambulatory Visit (INDEPENDENT_AMBULATORY_CARE_PROVIDER_SITE_OTHER): Payer: 59 | Admitting: Family Medicine

## 2020-02-18 ENCOUNTER — Encounter: Payer: Self-pay | Admitting: Family Medicine

## 2020-02-18 ENCOUNTER — Other Ambulatory Visit: Payer: Self-pay

## 2020-02-18 VITALS — BP 108/80 | HR 77 | Temp 97.7°F | Wt 251.6 lb

## 2020-02-18 DIAGNOSIS — R5383 Other fatigue: Secondary | ICD-10-CM

## 2020-02-18 DIAGNOSIS — R002 Palpitations: Secondary | ICD-10-CM | POA: Diagnosis not present

## 2020-02-18 DIAGNOSIS — E559 Vitamin D deficiency, unspecified: Secondary | ICD-10-CM

## 2020-02-18 DIAGNOSIS — R079 Chest pain, unspecified: Secondary | ICD-10-CM | POA: Diagnosis not present

## 2020-02-18 DIAGNOSIS — R635 Abnormal weight gain: Secondary | ICD-10-CM

## 2020-02-18 DIAGNOSIS — M79662 Pain in left lower leg: Secondary | ICD-10-CM | POA: Diagnosis not present

## 2020-02-18 NOTE — Patient Instructions (Addendum)
Health Maintenance Due  Topic Date Due   COVID-19 Vaccine (1) Never done   TETANUS/TDAP  Never done    No flowsheet data found. Nonspecific Chest Pain, Adult Chest pain can be caused by many different conditions. It can be caused by a condition that is life-threatening and requires treatment right away. It can also be caused by something that is not life-threatening. If you have chest pain, it can be hard to know the difference, so it is important to get help right away to make sure that you do not have a serious condition. Some life-threatening causes of chest pain include:  Heart attack.  A tear in the body's main blood vessel (aortic dissection).  Inflammation around your heart (pericarditis).  A problem in the lungs, such as a blood clot (pulmonary embolism) or a collapsed lung (pneumothorax). Some non life-threatening causes of chest pain include:  Heartburn.  Anxiety or stress.  Damage to the bones, muscles, and cartilage that make up your chest wall.  Pneumonia or bronchitis.  Shingles infection (varicella-zoster virus). Chest pain can feel like:  Pain or discomfort on the surface of your chest or deep in your chest.  Crushing, pressure, aching, or squeezing pain.  Burning or tingling.  Dull or sharp pain that is worse when you move, cough, or take a deep breath.  Pain or discomfort that is also felt in your back, neck, jaw, shoulder, or arm, or pain that spreads to any of these areas. Your chest pain may come and go. It may also be constant. Your health care provider will do lab tests and other studies to find the cause of your pain. Treatment will depend on the cause of your chest pain. Follow these instructions at home: Medicines  Take over-the-counter and prescription medicines only as told by your health care provider.  If you were prescribed an antibiotic, take it as told by your health care provider. Do not stop taking the antibiotic even if you start to  feel better. Lifestyle   Rest as directed by your health care provider.  Do not use any products that contain nicotine or tobacco, such as cigarettes and e-cigarettes. If you need help quitting, ask your health care provider.  Do not drink alcohol.  Make healthy lifestyle choices as recommended. These may include: ? Getting regular exercise. Ask your health care provider to suggest some activities that are safe for you. ? Eating a heart-healthy diet. This includes plenty of fresh fruits and vegetables, whole grains, low-fat (lean) protein, and low-fat dairy products. A dietitian can help you find healthy eating options. ? Maintaining a healthy weight. ? Managing any other health conditions you have, such as high blood pressure (hypertension) or diabetes. ? Reducing stress, such as with yoga or relaxation techniques. General instructions  Pay attention to any changes in your symptoms. Tell your health care provider about them or any new symptoms.  Avoid any activities that cause chest pain.  Keep all follow-up visits as told by your health care provider. This is important. This includes visits for any further testing if your chest pain does not go away. Contact a health care provider if:  Your chest pain does not go away.  You feel depressed.  You have a fever. Get help right away if:  Your chest pain gets worse.  You have a cough that gets worse, or you cough up blood.  You have severe pain in your abdomen.  You faint.  You have sudden, unexplained chest  discomfort.  You have sudden, unexplained discomfort in your arms, back, neck, or jaw.  You have shortness of breath at any time.  You suddenly start to sweat, or your skin gets clammy.  You feel nausea or you vomit.  You suddenly feel lightheaded or dizzy.  You have severe weakness, or unexplained weakness or fatigue.  Your heart begins to beat quickly, or it feels like it is skipping beats. These symptoms may  represent a serious problem that is an emergency. Do not wait to see if the symptoms will go away. Get medical help right away. Call your local emergency services (911 in the [place] Do not drive yourself to the hospital. Summary  Chest pain can be caused by a condition that is serious and requires urgent treatment. It may also be caused by something that is not life-threatening.  If you have chest pain, it is very important to see your health care provider. Your health care provider may do lab tests and other studies to find the cause of your pain.  Follow your health care provider's instructions on taking medicines, making lifestyle changes, and getting emergency treatment if symptoms become worse.  Keep all follow-up visits as told by your health care provider. This includes visits for any further testing if your chest pain does not go away. This information is not intended to replace advice given to you by your health care provider. Make sure you discuss any questions you have with your health care provider. Document Revised: 01/26/2018 Document Reviewed: 01/26/2018 Elsevier Patient Education  2020 [employer].  Palpitations Palpitations are feelings that your heartbeat is irregular or is faster than normal. It may feel like your heart is fluttering or skipping a beat. Palpitations are usually not a serious problem. They may be caused by many things, including smoking, caffeine, alcohol, stress, and certain medicines or drugs. Most causes of palpitations are not serious. However, some palpitations can be a sign of a serious problem. You may need further tests to rule out serious medical problems. Follow these instructions at home:     Pay attention to any changes in your condition. Take these actions to help manage your symptoms: Eating and drinking  Avoid foods and drinks that may cause palpitations. These may include: ? Caffeinated coffee, tea, soft drinks, diet pills, and energy  drinks. ? Chocolate. ? Alcohol. Lifestyle  Take steps to reduce your stress and anxiety. Things that can help you relax include: ? Yoga. ? Mind-body activities, such as deep breathing, meditation, or using words and images to create positive thoughts (guided imagery). ? Physical activity, such as swimming, jogging, or walking. Tell your health care provider if your palpitations increase with activity. If you have chest pain or shortness of breath with activity, do not continue the activity until you are seen by your health care provider. ? Biofeedback. This is a method that helps you learn to use your mind to control things in your body, such as your heartbeat.  Do not use drugs, including cocaine or ecstasy. Do not use marijuana.  Get plenty of rest and sleep. Keep a regular bed time. General instructions  Take over-the-counter and prescription medicines only as told by your health care provider.  Do not use any products that contain nicotine or tobacco, such as cigarettes and e-cigarettes. If you need help quitting, ask your health care provider.  Keep all follow-up visits as told by your health care provider. This is important. These may include visits for further  testing if palpitations do not go away or get worse. Contact a health care provider if you:  Continue to have a fast or irregular heartbeat after 24 hours.  Notice that your palpitations occur more often. Get help right away if you:  Have chest pain or shortness of breath.  Have a severe headache.  Feel dizzy or you faint. Summary  Palpitations are feelings that your heartbeat is irregular or is faster than normal. It may feel like your heart is fluttering or skipping a beat.  Palpitations may be caused by many things, including smoking, caffeine, alcohol, stress, certain medicines, and drugs.  Although most causes of palpitations are not serious, some causes can be a sign of a serious medical problem.  Get help  right away if you faint or have chest pain, shortness of breath, a severe headache, or dizziness. This information is not intended to replace advice given to you by your health care provider. Make sure you discuss any questions you have with your health care provider. Document Revised: 09/07/2017 Document Reviewed: 09/07/2017 Elsevier Patient Education  2020 [employer].

## 2020-02-18 NOTE — Progress Notes (Signed)
Subjective:    Patient ID: Lori Vaughn, female    DOB: Mar 02, 1986, 34 y.o.   MRN: [id number]  Chief Complaint  Patient presents with  . Chest Pain  . Leg Pain    HPI Patient was seen today for ongoing concern.  Pt notes L calf pain x 1 wk, a general unwell feeling, and CP with laying down.  Pt notes sharp pain in chest with lying down, intermittent at times, but has lasted all day.  Pain also noted with leaning forward.  Patient denies lower extremity edema, erythema.  Endorses increased sitting during the day when she is home with her new baby.  Pt gave birth to a healthy baby girl via SVD on January 25.  She is exclusively breast-feeding.  Pt notes weight gain since being pregnant.  Pt hopes to weigh around 180 pounds.    Pt notes increased concerns for her symptoms as she was on had a pregnant coworker die and a family member who had heart problems at a young age.  Past Medical History:  Diagnosis Date  . Amenorrhea 01/28/2017  . Amenorrhea, secondary 01/28/2017  . Bacterial vaginitis 01/28/2017  . Elevated serum hCG 01/28/2017   HCG 26 on 01/28/17  . Migraines   . PCOS (polycystic ovarian syndrome)   . PCOS (polycystic ovarian syndrome)     Allergies  Allergen Reactions  . Molds & Smuts Other (See Comments)    Nasal polyps    ROS General: Denies fever, chills, night sweats, changes in weight, changes in appetite  +fatigue HEENT: Denies headaches, ear pain, changes in vision, rhinorrhea, sore throat CV: Denies SOB, orthopnea +CP, palpitations, L calf pain Pulm: Denies SOB, cough, wheezing GI: Denies abdominal pain, nausea, vomiting, diarrhea, constipation GU: Denies dysuria, hematuria, frequency, vaginal discharge Msk: Denies muscle cramps, joint pains Neuro: Denies weakness, numbness, tingling Skin: Denies rashes, bruising Psych: Denies depression, anxiety, hallucinations     Objective:    Blood pressure 108/80, pulse 77, temperature 97.7 F (36.5 C),  temperature source Other (Comment), weight 251 lb 9.6 oz (114.1 kg), SpO2 98 %, unknown if currently breastfeeding.   Gen. Pleasant, well-nourished, in no distress, normal affect   HEENT: [place]/AT, face symmetric, conjunctiva clear, no scleral icterus, PERRLA, EOMI, nares patent without drainage, pharynx without erythema or exudate. Neck: No JVD, no thyromegaly, L carotid bruit.  No bruit on the right. Lungs: no accessory muscle use, CTAB, no wheezes or rales Cardiovascular: RRR, no m/r/g, no peripheral edema Musculoskeletal: No deformities, no cyanosis or clubbing, normal tone TTP of left calf.,  No lower extremity edema.  Bilateral calf 44 cm Neuro:  A&Ox3, CN II-XII intact, normal gait Skin:  Warm, no lesions/ rash   Wt Readings from Last 3 Encounters:  02/18/20 251 lb 9.6 oz (114.1 kg)  12/22/18 199 lb (90.3 kg)  12/04/18 192 lb (87.1 kg)    Lab Results  Component Value Date   WBC 7.3 03/24/2017   HGB 12.8 03/24/2017   HCT 36.7 03/24/2017   PLT 304 03/24/2017   GLUCOSE 97 03/24/2017   ALT 15 03/24/2017   AST 17 03/24/2017   NA 138 03/24/2017   K 3.3 (L) 03/24/2017   CL 104 03/24/2017   CREATININE 1.10 (H) 03/24/2017   BUN 12 03/24/2017   CO2 23 03/24/2017   TSH 0.94 01/30/2016   HGBA1C 4.9 08/29/2018    Assessment/Plan:  Chest pain, unspecified type  -Discussed possible causes including PE, deconditioning anxiety, pericarditis, infectious causes, arrhythmia -EKG  obtained this visit NSR with nonspecific ST elevation in V2.  Previous studies view for comparison -We will obtain labs -We will place referral if needed to cardiology or pulmonology based on labs and continued symptoms.  Consider PFTs -Given precautions - Plan: EKG 12-Lead, D-dimer, Quantitative, CXR  Pain of left calf  -Consider DVT -Reassuring bilateral calves of equal size, 44 cm - Plan: VAS [place] LOWER EXTREMITY VENOUS (DVT), D-dimer, Quantitative  Weight gain  -2/2 recent pregnancy.  Almost 7 months  postpartum -Discussed lifestyle modifications - Plan: TSH, T4, Free  Palpitations -Discussed possible causes including thyroid dysfunction, anxiety, anemia, arrhythmia -EKG with NSR.  Nonspecific ST elevation in V2.  Cannot rule out ischemia.  EKG from 2018 reviewed for comparison. -Discussed obtaining labs -For continued or worsening symptoms consider cardiology referral -Given precautions - Plan: Comprehensive metabolic panel, TSH, ECHOCARDIOGRAM COMPLETE, Brain Natriuretic Peptide, CBC with Differential/Platelet, T4, Free, CXR  Fatigue, unspecified type  -Discussed possible causes including decreased sleep 2/2 newborn, vitamin deficiency, electrolyte deficiency, anemia, DVT or PE -Consider PFTs - Plan: Vitamin B12, Vitamin D, 25-hydroxy  F/u in the next week for continued symptoms  Update: Vitamin D mildly low at 28.  Rx for ergocalciferol 50,000 IUs sent to pharmacy  Abbe Amsterdam, MD

## 2020-02-19 ENCOUNTER — Encounter: Payer: Self-pay | Admitting: Family Medicine

## 2020-02-19 ENCOUNTER — Telehealth: Payer: Self-pay | Admitting: Family Medicine

## 2020-02-19 ENCOUNTER — Ambulatory Visit (HOSPITAL_COMMUNITY): Payer: 59 | Attending: Cardiology

## 2020-02-19 DIAGNOSIS — R002 Palpitations: Secondary | ICD-10-CM | POA: Insufficient documentation

## 2020-02-19 LAB — COMPREHENSIVE METABOLIC PANEL
AG Ratio: 1.4 (calc) (ref 1.0–2.5)
ALT: 26 U/L (ref 6–29)
AST: 20 U/L (ref 10–30)
Albumin: 4 g/dL (ref 3.6–5.1)
Alkaline phosphatase (APISO): 85 U/L (ref 31–125)
BUN: 12 mg/dL (ref 7–25)
CO2: 26 mmol/L (ref 20–32)
Calcium: 9.4 mg/dL (ref 8.6–10.2)
Chloride: 105 mmol/L (ref 98–110)
Creat: 1 mg/dL (ref 0.50–1.10)
Globulin: 2.9 g/dL (calc) (ref 1.9–3.7)
Glucose, Bld: 86 mg/dL (ref 65–99)
Potassium: 4.5 mmol/L (ref 3.5–5.3)
Sodium: 139 mmol/L (ref 135–146)
Total Bilirubin: 0.7 mg/dL (ref 0.2–1.2)
Total Protein: 6.9 g/dL (ref 6.1–8.1)

## 2020-02-19 LAB — CBC WITH DIFFERENTIAL/PLATELET
Absolute Monocytes: 440 cells/uL (ref 200–950)
Basophils Absolute: 40 cells/uL (ref 0–200)
Basophils Relative: 0.8 %
Eosinophils Absolute: 490 cells/uL (ref 15–500)
Eosinophils Relative: 9.8 %
HCT: 39.7 % (ref 35.0–45.0)
Hemoglobin: 13.2 g/dL (ref 11.7–15.5)
Lymphs Abs: 1895 cells/uL (ref 850–3900)
MCH: 28.9 pg (ref 27.0–33.0)
MCHC: 33.2 g/dL (ref 32.0–36.0)
MCV: 86.9 fL (ref 80.0–100.0)
MPV: 10.8 fL (ref 7.5–12.5)
Monocytes Relative: 8.8 %
Neutro Abs: 2135 cells/uL (ref 1500–7800)
Neutrophils Relative %: 42.7 %
Platelets: 242 10*3/uL (ref 140–400)
RBC: 4.57 10*6/uL (ref 3.80–5.10)
RDW: 13.1 % (ref 11.0–15.0)
Total Lymphocyte: 37.9 %
WBC: 5 10*3/uL (ref 3.8–10.8)

## 2020-02-19 LAB — T4, FREE: Free T4: 1 ng/dL (ref 0.8–1.8)

## 2020-02-19 LAB — BRAIN NATRIURETIC PEPTIDE: Brain Natriuretic Peptide: 31 pg/mL (ref <100)

## 2020-02-19 LAB — D-DIMER, QUANTITATIVE: D-Dimer, Quant: 0.47 mcg/mL FEU (ref <0.50)

## 2020-02-19 LAB — VITAMIN D 25 HYDROXY (VIT D DEFICIENCY, FRACTURES): Vit D, 25-Hydroxy: 28 ng/mL — ABNORMAL LOW (ref 30–100)

## 2020-02-19 LAB — TSH: TSH: 1.59 mIU/L

## 2020-02-19 LAB — VITAMIN B12: Vitamin B-12: 592 pg/mL (ref 200–1100)

## 2020-02-19 NOTE — Telephone Encounter (Signed)
Pt had lab done last week and received the results via Mychart. Pt has a few questions regarding her labs. Thanks

## 2020-02-19 NOTE — Telephone Encounter (Signed)
Spoke to pt and she stated she does not understand her labs. I advised pt that they will receive the lab work before the providers has a chance to result them. Pt advised that once Dr.Banks result the labs we will give her a call back. Pt verbalized understanding.

## 2020-02-20 ENCOUNTER — Encounter: Payer: Self-pay | Admitting: Family Medicine

## 2020-02-20 ENCOUNTER — Other Ambulatory Visit: Payer: Self-pay

## 2020-02-20 ENCOUNTER — Ambulatory Visit (HOSPITAL_COMMUNITY)
Admission: RE | Admit: 2020-02-20 | Discharge: 2020-02-20 | Disposition: A | Discharge status: Home / Self Care | Payer: 59 | Source: Ambulatory Visit | Attending: Internal Medicine | Admitting: Internal Medicine

## 2020-02-20 DIAGNOSIS — E559 Vitamin D deficiency, unspecified: Secondary | ICD-10-CM | POA: Insufficient documentation

## 2020-02-20 DIAGNOSIS — M79662 Pain in left lower leg: Secondary | ICD-10-CM | POA: Insufficient documentation

## 2020-02-20 MED ORDER — VITAMIN D (ERGOCALCIFEROL) 1.25 MG (50000 UNIT) PO CAPS: 50000.0000 [IU] | ORAL_CAPSULE | ORAL | 0 refills | Status: DC

## 2020-02-28 ENCOUNTER — Encounter: Payer: Self-pay | Admitting: Family Medicine

## 2020-03-26 ENCOUNTER — Other Ambulatory Visit: Payer: Self-pay

## 2020-03-26 ENCOUNTER — Encounter: Payer: Self-pay | Admitting: Family Medicine

## 2020-03-26 ENCOUNTER — Ambulatory Visit (INDEPENDENT_AMBULATORY_CARE_PROVIDER_SITE_OTHER): Payer: 59 | Admitting: Family Medicine

## 2020-03-26 VITALS — BP 110/70 | HR 70 | Temp 97.9°F | Ht 71.0 in | Wt 251.0 lb

## 2020-03-26 DIAGNOSIS — R350 Frequency of micturition: Secondary | ICD-10-CM

## 2020-03-26 LAB — POCT URINALYSIS DIPSTICK
Bilirubin, UA: NEGATIVE
Blood, UA: NEGATIVE
Glucose, UA: NEGATIVE
Ketones, UA: NEGATIVE
Leukocytes, UA: NEGATIVE
Nitrite, UA: NEGATIVE
Protein, UA: NEGATIVE
Spec Grav, UA: 1.015 (ref 1.010–1.025)
Urobilinogen, UA: 0.2 E.U./dL
pH, UA: 7.5 (ref 5.0–8.0)

## 2020-03-26 LAB — GLUCOSE, POCT (MANUAL RESULT ENTRY): POC Glucose: 92 mg/dl (ref 70–99)

## 2020-03-26 LAB — POCT GLYCOSYLATED HEMOGLOBIN (HGB A1C): Hemoglobin A1C: 5.6 % (ref 4.0–5.6)

## 2020-03-26 NOTE — Patient Instructions (Addendum)
Health Maintenance Due  Topic Date Due  . COVID-19 Vaccine (1) Never done  . TETANUS/TDAP  Never done  . INFLUENZA VACCINE  03/09/2020    No flowsheet data found. Urinary Frequency, Adult Urinary frequency means urinating more often than usual. You may urinate every 1-2 hours even though you drink a normal amount of fluid and do not have a bladder infection or condition. Although you urinate more often than normal, the total amount of urine produced in a day is normal. With urinary frequency, you may have an urgent need to urinate often. The stress and anxiety of needing to find a bathroom quickly can make this urge worse. This condition may go away on its own or you may need treatment at home. Home treatment may include bladder training, exercises, taking medicines, or making changes to your diet. Follow these instructions at home: Bladder health   Keep a bladder diary if told by your health care provider. Keep track of: ? What you eat and drink. ? How often you urinate. ? How much you urinate.  Follow a bladder training program if told by your health care provider. This may include: ? Learning to delay going to the bathroom. ? Double urinating (voiding). This helps if you are not completely emptying your bladder. ? Scheduled voiding.  Do Kegel exercises as told by your health care provider. Kegel exercises strengthen the muscles that help control urination, which may help the condition. Eating and drinking  If told by your health care provider, make diet changes, such as: ? Avoiding caffeine. ? Drinking fewer fluids, especially alcohol. ? Not drinking in the evening. ? Avoiding foods or drinks that may irritate the bladder. These include coffee, tea, soda, artificial sweeteners, citrus, tomato-based foods, and chocolate. ? Eating foods that help prevent or ease constipation. Constipation can make this condition worse. Your health care provider may recommend that you:  Drink  enough fluid to keep your urine pale yellow.  Take over-the-counter or prescription medicines.  Eat foods that are high in fiber, such as beans, whole grains, and fresh fruits and vegetables.  Limit foods that are high in fat and processed sugars, such as fried or sweet foods. General instructions  Take over-the-counter and prescription medicines only as told by your health care provider.  Keep all follow-up visits as told by your health care provider. This is important. Contact a health care provider if:  You start urinating more often.  You feel pain or irritation when you urinate.  You notice blood in your urine.  Your urine looks cloudy.  You develop a fever.  You begin vomiting. Get help right away if:  You are unable to urinate. Summary  Urinary frequency means urinating more often than usual. With urinary frequency, you may urinate every 1-2 hours even though you drink a normal amount of fluid and do not have a bladder infection or other bladder condition.  Your health care provider may recommend that you keep a bladder diary, follow a bladder training program, or make dietary changes.  If told by your health care provider, do Kegel exercises to strengthen the muscles that help control urination.  Take over-the-counter and prescription medicines only as told by your health care provider.  Contact a health care provider if your symptoms do not improve or get worse. This information is not intended to replace advice given to you by your health care provider. Make sure you discuss any questions you have with your health care provider. Document  Revised: 02/02/2018 Document Reviewed: 02/02/2018 Elsevier Patient Education  [place].

## 2020-03-26 NOTE — Progress Notes (Signed)
Subjective:    Patient ID: Lori Vaughn, female    DOB: 09/30/1985, 34 y.o.   MRN: [id number]  Chief Complaint  Patient presents with  . Urinary Frequency    sx 1 week. urinate every 3 hrs denies dysuria or discharge.     HPI Patient was seen today for acute concern.  Pt notes urinary frequency over the last week.  Patient endorses using the restroom every 3 hours at night.  Has been drinking more water.  Denies increased hunger or thirst, vaginal d/c, suprapubic pain, n/v, chills, fever, low back pain or flank pain that moves.  Pt breastfeeding.  Past Medical History:  Diagnosis Date  . Amenorrhea 01/28/2017  . Amenorrhea, secondary 01/28/2017  . Bacterial vaginitis 01/28/2017  . Elevated serum hCG 01/28/2017   HCG 26 on 01/28/17  . Migraines   . PCOS (polycystic ovarian syndrome)   . PCOS (polycystic ovarian syndrome)     Allergies  Allergen Reactions  . Molds & Smuts Other (See Comments)    Nasal polyps    ROS General: Denies fever, chills, night sweats, changes in weight, changes in appetite HEENT: Denies headaches, ear pain, changes in vision, rhinorrhea, sore throat CV: Denies CP, palpitations, SOB, orthopnea Pulm: Denies SOB, cough, wheezing GI: Denies abdominal pain, nausea, vomiting, diarrhea, constipation GU: Denies dysuria, hematuria, vaginal discharge  +urinary  frequency Msk: Denies muscle cramps, joint pains Neuro: Denies weakness, numbness, tingling Skin: Denies rashes, bruising Psych: Denies depression, anxiety, hallucinations    Objective:    Blood pressure 110/70, pulse 70, temperature 97.9 F (36.6 C), temperature source Oral, height 5\' 11"  (1.803 m), weight 251 lb (113.9 kg), last menstrual period 03/09/2020, SpO2 94 %, currently breastfeeding.  Gen. Pleasant, well-nourished, in no distress, normal affect   HEENT: [place]/AT, face symmetric, conjunctiva clear, no scleral icterus, PERRLA, EOMI, nares patent without drainage Lungs: no accessory  muscle use Cardiovascular: RRR, no peripheral edema Neuro:  A&Ox3, CN II-XII intact, normal gait Skin:  Warm, no lesions/ rash   Wt Readings from Last 3 Encounters:  03/26/20 251 lb (113.9 kg)  02/18/20 251 lb 9.6 oz (114.1 kg)  12/22/18 199 lb (90.3 kg)    Lab Results  Component Value Date   WBC 5.0 02/18/2020   HGB 13.2 02/18/2020   HCT 39.7 02/18/2020   PLT 242 02/18/2020   GLUCOSE 86 02/18/2020   ALT 26 02/18/2020   AST 20 02/18/2020   NA 139 02/18/2020   K 4.5 02/18/2020   CL 105 02/18/2020   CREATININE 1.00 02/18/2020   BUN 12 02/18/2020   CO2 26 02/18/2020   TSH 1.59 02/18/2020   HGBA1C 4.9 08/29/2018    Assessment/Plan:  Frequent urination  -discussed possible causes including increased fluid intake, UTI, DM -UA normal. -fsbs 92 -Hgb A1C 5.3% -discussed foods that can irritate bladder including caffeine, spicy, or acidic items.  Pt to review food intake and decrease these items if needed -given handout - Plan: POC Urinalysis Dipstick, POC Glucose (CBG), POC HgB A1c  F/u prn for continued or worsened symptoms.  08/31/2018, MD

## 2020-06-16 ENCOUNTER — Other Ambulatory Visit: Payer: Self-pay

## 2020-06-17 ENCOUNTER — Ambulatory Visit (INDEPENDENT_AMBULATORY_CARE_PROVIDER_SITE_OTHER): Payer: 59 | Admitting: Adult Health

## 2020-06-17 ENCOUNTER — Encounter: Payer: Self-pay | Admitting: Adult Health

## 2020-06-17 VITALS — BP 102/80 | HR 63 | Temp 97.8°F | Ht 71.0 in | Wt 246.8 lb

## 2020-06-17 DIAGNOSIS — R350 Frequency of micturition: Secondary | ICD-10-CM | POA: Diagnosis not present

## 2020-06-17 LAB — POC URINALSYSI DIPSTICK (AUTOMATED)
Bilirubin, UA: NEGATIVE
Blood, UA: NEGATIVE
Glucose, UA: NEGATIVE
Ketones, UA: NEGATIVE
Leukocytes, UA: NEGATIVE
Nitrite, UA: NEGATIVE
Protein, UA: NEGATIVE
Spec Grav, UA: 1.015 (ref 1.010–1.025)
Urobilinogen, UA: 0.2 E.U./dL
pH, UA: 6.5 (ref 5.0–8.0)

## 2020-06-17 NOTE — Addendum Note (Signed)
Addended by: Lerry Liner on: 06/17/2020 09:04 AM   Modules accepted: Orders

## 2020-06-17 NOTE — Progress Notes (Signed)
Subjective:    Patient ID: Lori Vaughn, female    DOB: [birth year], 34 y.o.   MRN: [id number]  HPI 34 year old female who  has a past medical history of Amenorrhea (01/28/2017), Amenorrhea, secondary (01/28/2017), Bacterial vaginitis (01/28/2017), Elevated serum hCG (01/28/2017), Migraines, PCOS (polycystic ovarian syndrome), and PCOS (polycystic ovarian syndrome).  She is a patient of Dr. Volanda Napoleon who I am seeing today for concern of UTI. She reports that over the last two weeks she has been having more frequent urination especially at night, where she goes " at least three times a night." She also reports that her urine has a sweet odor. She denies dysuria, hematuria, or pelvic pain.   Has a 77 month old child   She was seen by her PCP two months ago with similar symptoms. Workup including UA, A1c, and POC glucose were normal.   She does not drink caffeine but does eat spicy foods.    Review of Systems See HPI   Past Medical History:  Diagnosis Date  . Amenorrhea 01/28/2017  . Amenorrhea, secondary 01/28/2017  . Bacterial vaginitis 01/28/2017  . Elevated serum hCG 01/28/2017   HCG 26 on 01/28/17  . Migraines   . PCOS (polycystic ovarian syndrome)   . PCOS (polycystic ovarian syndrome)     Social History   Socioeconomic History  . Marital status: Single    Spouse name: Not on file  . Number of children: Not on file  . Years of education: Not on file  . Highest education level: Not on file  Occupational History  . Not on file  Tobacco Use  . Smoking status: Never Smoker  . Smokeless tobacco: Never Used  Vaping Use  . Vaping Use: Never used  Substance and Sexual Activity  . Alcohol use: Yes    Comment: Occasionally  . Drug use: Yes    Types: Marijuana  . Sexual activity: Yes    Birth control/protection: None  Other Topics Concern  . Not on file  Social History Narrative   She works as Surveyor, quantity for career and Psychologist, counselling at [employer].   Highest  level of education:  Will be completing doctorate in [employer]   She lives with boyfriend and his cousin.    Social Determinants of Health   Financial Resource Strain:   . Difficulty of Paying Living Expenses: Not on file  Food Insecurity:   . Worried About Charity fundraiser in the Last Year: Not on file  . Ran Out of Food in the Last Year: Not on file  Transportation Needs:   . Lack of Transportation (Medical): Not on file  . Lack of Transportation (Non-Medical): Not on file  Physical Activity:   . Days of Exercise per Week: Not on file  . Minutes of Exercise per Session: Not on file  Stress:   . Feeling of Stress : Not on file  Social Connections:   . Frequency of Communication with Friends and Family: Not on file  . Frequency of Social Gatherings with Friends and Family: Not on file  . Attends Religious Services: Not on file  . Active Member of Clubs or Organizations: Not on file  . Attends Archivist Meetings: Not on file  . Marital Status: Not on file  Intimate Partner Violence:   . Fear of Current or Ex-Partner: Not on file  . Emotionally Abused: Not on file  . Physically Abused: Not on file  . Sexually Abused: Not on  file    Past Surgical History:  Procedure Laterality Date  . NASAL SINUS SURGERY      Family History  Problem Relation Age of Onset  . Diabetes Father   . Hypertension Father   . Cystic fibrosis Father   . Sickle cell trait Father   . Diabetes Maternal Grandmother   . Diabetes Paternal Grandmother   . Bipolar disorder Mother   . Depression Mother   . Healthy Sister     Allergies  Allergen Reactions  . Molds & Smuts Other (See Comments)    Nasal polyps    Current Outpatient Medications on File Prior to Visit  Medication Sig Dispense Refill  . Multiple Vitamins-Minerals (MULTIVITAMIN ADULTS PO) Take by mouth.     No current facility-administered medications on file prior to visit.    BP 102/80 (BP Location: Left Arm, Patient  Position: Sitting, Cuff Size: Large)   Pulse 63   Temp 97.8 F (36.6 C) (Oral)   Ht '5\' 11"'  (1.803 m)   Wt 246 lb 12.8 oz (111.9 kg)   LMP 05/09/2020 (Approximate)   BMI 34.42 kg/m       Objective:   Physical Exam Vitals and nursing note reviewed.  Constitutional:      Appearance: Normal appearance. She is obese.  Abdominal:     General: Abdomen is flat. Bowel sounds are normal.     Palpations: Abdomen is soft.     Tenderness: There is no abdominal tenderness. There is no right CVA tenderness or left CVA tenderness.  Musculoskeletal:        General: Normal range of motion.  Skin:    General: Skin is warm and dry.     Capillary Refill: Capillary refill takes less than 2 seconds.  Neurological:     General: No focal deficit present.     Mental Status: She is alert and oriented to person, place, and time.  Psychiatric:        Mood and Affect: Mood normal.        Behavior: Behavior normal.        Thought Content: Thought content normal.        Judgment: Judgment normal.       Assessment & Plan:  1. Urine frequency - U/A completely normal. Advised against spicy foods that can irritate bladder. Can try pelvic floor exercises or follow up with GYN if symptoms continue. Will check BMP  - BMP with eGFR([facility]); Future - POC Urinalysis Dipstick  Dorothyann Peng, NP

## 2020-06-18 ENCOUNTER — Other Ambulatory Visit: Payer: 59

## 2020-08-06 ENCOUNTER — Encounter: Payer: Self-pay | Admitting: Internal Medicine

## 2020-08-06 ENCOUNTER — Telehealth (INDEPENDENT_AMBULATORY_CARE_PROVIDER_SITE_OTHER): Payer: 59 | Admitting: Internal Medicine

## 2020-08-06 VITALS — Temp 98.4°F | Ht 71.0 in | Wt 244.0 lb

## 2020-08-06 DIAGNOSIS — Z20822 Contact with and (suspected) exposure to covid-19: Secondary | ICD-10-CM | POA: Diagnosis not present

## 2020-08-06 DIAGNOSIS — J22 Unspecified acute lower respiratory infection: Secondary | ICD-10-CM | POA: Diagnosis not present

## 2020-08-06 NOTE — Progress Notes (Signed)
Virtual Visit via Video Note  I connected with@ on 08/06/20 at  2:30 PM EST by a video enabled telemedicine application and verified that I am speaking with the correct person using two identifiers. Location patient: home Location provider:work  office Persons participating in the virtual visit: patient, provider  [place] national recommendations  regarding COVID 19 pandemic   video visit is advised over in office visit for this patient.  Patient aware  of the limitations of evaluation and management by telemedicine and  availability of in person appointments. and agreed to proceed.   HPI: Lori Vaughn presents for video visit   PCP appt NA  Onset perhaps the day before Christmas something in her throat that developed into a cough myalgias fever fatigue. Couple days before her boyfriend was not feeling well with similar symptoms.  So he did not join the Christmas gathering.  Christmas evening or December 26 daughter 24-month-old developed some cough and fever. Boyfriend and daughter tested positive for Covid but she tested negative She has the same symptoms with congestion cough tired shortness of breath probably more from the mask. She had one Materna vaccine in October.  ROS: See pertinent positives and negatives per HPI.  No underlying asthma respiratory disease  Past Medical History:  Diagnosis Date  . Amenorrhea 01/28/2017  . Amenorrhea, secondary 01/28/2017  . Bacterial vaginitis 01/28/2017  . Elevated serum hCG 01/28/2017   HCG 26 on 01/28/17  . Migraines   . PCOS (polycystic ovarian syndrome)   . PCOS (polycystic ovarian syndrome)     Past Surgical History:  Procedure Laterality Date  . NASAL SINUS SURGERY      Family History  Problem Relation Age of Onset  . Diabetes Father   . Hypertension Father   . Cystic fibrosis Father   . Sickle cell trait Father   . Diabetes Maternal Grandmother   . Diabetes Paternal Grandmother   . Bipolar disorder Mother   .  Depression Mother   . Healthy Sister     Social History   Tobacco Use  . Smoking status: Never Smoker  . Smokeless tobacco: Never Used  Vaping Use  . Vaping Use: Never used  Substance Use Topics  . Alcohol use: Yes    Comment: Occasionally  . Drug use: Yes    Types: Marijuana      Current Outpatient Medications:  Marland Kitchen  Multiple Vitamins-Minerals (MULTIVITAMIN ADULTS PO), Take by mouth., Disp: , Rfl:   EXAM: BP Readings from Last 3 Encounters:  06/17/20 102/80  03/26/20 110/70  02/18/20 108/80    VITALS per patient if applicable:  GENERAL: alert, oriented, appears well and in no acute distress holding her child  HEENT: atraumatic, conjunttiva clear, no obvious abnormalities on inspection of external nose and ears minimally congested  NECK: normal movements of the head and neck  LUNGS: on inspection no signs of respiratory distress, breathing rate appears normal, no obvious gross SOB, gasping or wheezing  CV: no obvious cyanosis  MS: moves all visible extremities without noticeable abnormality  PSYCH/NEURO: pleasant and cooperative, no obvious depression or anxiety, speech and thought processing grossly intact Lab Results  Component Value Date   WBC 5.0 02/18/2020   HGB 13.2 02/18/2020   HCT 39.7 02/18/2020   PLT 242 02/18/2020   GLUCOSE 86 02/18/2020   ALT 26 02/18/2020   AST 20 02/18/2020   NA 139 02/18/2020   K 4.5 02/18/2020   CL 105 02/18/2020   CREATININE 1.00 02/18/2020  BUN 12 02/18/2020   CO2 26 02/18/2020   TSH 1.59 02/18/2020   HGBA1C 5.6 03/26/2020    ASSESSMENT AND PLAN:  Discussed the following assessment and plan:    ICD-10-CM   1. Suspected COVID-19 virus infection  Z20.822   2. Acute respiratory infection  J22   3. Exposure to confirmed case of COVID-19  Z20.822   Despite her negative test her daughter and her boyfriend with similar symptoms have positive test this is most likely COVID-19 SARS-CoV-2 in a partially vaccinated  person. She has had one Moderna vaccine in October one-point on the high risk scale because of weight Discussed supportive care over the counters as possible if she wishes expectant management alarm symptoms She is still low risk for monoclonal antibody infusion but did discuss this Advise follow-up televisit with PCP in 4 to 7 days or as needed  Can discuss with PCP about timing of second Covid vaccine consider delaying until recovered or 3 months Counseled.   Expectant management and discussion of plan and treatment with opportunity to ask questions and all were answered. The patient agreed with the plan and demonstrated an understanding of the instructions.  30 minutes preview  Counsel visit plan  Advised to call back or seek an in-person evaluation if worsening  or having  further concerns . Return for 4-7 days with PCP video if needed  and prn .   Berniece Andreas, MD

## 2020-08-14 ENCOUNTER — Ambulatory Visit (INDEPENDENT_AMBULATORY_CARE_PROVIDER_SITE_OTHER): Payer: 59

## 2020-08-14 ENCOUNTER — Ambulatory Visit (INDEPENDENT_AMBULATORY_CARE_PROVIDER_SITE_OTHER): Payer: 59 | Admitting: Family Medicine

## 2020-08-14 ENCOUNTER — Encounter: Payer: Self-pay | Admitting: Family Medicine

## 2020-08-14 ENCOUNTER — Other Ambulatory Visit: Payer: Self-pay

## 2020-08-14 VITALS — BP 108/78 | HR 80 | Temp 98.2°F | Wt 244.0 lb

## 2020-08-14 DIAGNOSIS — Z20822 Contact with and (suspected) exposure to covid-19: Secondary | ICD-10-CM

## 2020-08-14 DIAGNOSIS — M79662 Pain in left lower leg: Secondary | ICD-10-CM | POA: Diagnosis not present

## 2020-08-14 DIAGNOSIS — J40 Bronchitis, not specified as acute or chronic: Secondary | ICD-10-CM | POA: Diagnosis not present

## 2020-08-14 MED ORDER — ALBUTEROL SULFATE HFA 108 (90 BASE) MCG/ACT IN AERS: 2.0000 | INHALATION_SPRAY | Freq: Four times a day (QID) | RESPIRATORY_TRACT | 0 refills | Status: DC | PRN

## 2020-08-14 NOTE — Progress Notes (Signed)
Subjective:    Patient ID: Peggye Fothergill, female    DOB: 09-Aug-1986, 35 y.o.   MRN: [id number]  No chief complaint on file.   HPI Patient was seen today for f/u.  Pt seen virtually on 08/06/20 for suspected COVID despite a negative test, as her BF and infant daughter were symptomatic and both tested positive.  Pt with continued chest tightness, wheezing, and HA.  Also notes sharp pain in L calf.  In the past had a negative u/s for similar symptoms.  Past Medical History:  Diagnosis Date  . Amenorrhea 01/28/2017  . Amenorrhea, secondary 01/28/2017  . Bacterial vaginitis 01/28/2017  . Elevated serum hCG 01/28/2017   HCG 26 on 01/28/17  . Migraines   . PCOS (polycystic ovarian syndrome)   . PCOS (polycystic ovarian syndrome)     Allergies  Allergen Reactions  . Molds & Smuts Other (See Comments)    Nasal polyps    ROS General: Denies fever, chills, night sweats, changes in weight, changes in appetite HEENT: Denies headaches, ear pain, changes in vision, rhinorrhea, sore throat CV: Denies CP, palpitations, orthopnea  +SOB Pulm: Denies cough +SOB, wheezing GI: Denies abdominal pain, nausea, vomiting, diarrhea, constipation GU: Denies dysuria, hematuria, frequency, vaginal discharge Msk: Denies muscle cramps, joint pains  +L calf pain  Neuro: Denies weakness, numbness, tingling Skin: Denies rashes, bruising Psych: Denies depression, anxiety, hallucinations     Objective:    Blood pressure 108/78, pulse 80, temperature 98.2 F (36.8 C), temperature source Oral, weight 244 lb (110.7 kg), SpO2 97 %, currently breastfeeding.  Gen. Pleasant, well-nourished, in no distress, normal affect   HEENT: [place]/AT, face symmetric, conjunctiva clear, no scleral icterus, PERRLA, EOMI, nares patent without drainage Lungs: no accessory muscle use, wheezing in posterior mid R lobe.  No rhonchi or rales Cardiovascular: RRR, no m/r/g, no peripheral edema Musculoskeletal: TTP of medial left  calf and popliteal fossa.  No edema or erythema noted.  No deformities, no cyanosis or clubbing, normal tone Neuro:  A&Ox3, CN II-XII intact, normal gait Skin:  Warm, no lesions/ rash   Wt Readings from Last 3 Encounters:  08/14/20 244 lb (110.7 kg)  08/06/20 244 lb (110.7 kg)  06/17/20 246 lb 12.8 oz (111.9 kg)    Lab Results  Component Value Date   WBC 5.0 02/18/2020   HGB 13.2 02/18/2020   HCT 39.7 02/18/2020   PLT 242 02/18/2020   GLUCOSE 86 02/18/2020   ALT 26 02/18/2020   AST 20 02/18/2020   NA 139 02/18/2020   K 4.5 02/18/2020   CL 105 02/18/2020   CREATININE 1.00 02/18/2020   BUN 12 02/18/2020   CO2 26 02/18/2020   TSH 1.59 02/18/2020   HGBA1C 5.6 03/26/2020    Assessment/Plan:  Suspected COVID-19 virus infection -given symptoms and close exposure of household members pt likely with COVID infection despite negative test results -discussed quarantine and given precautions  Close exposure to COVID-19 virus -As above  Bronchitis  -Likely 2/2 COVID-19 virus infection -We will start albuterol inhaler -Continue supportive care -Given precautions for continued or worsening symptoms -will obtain CXR given continued sx.  CXR negative for acute process. - Plan: albuterol (VENTOLIN HFA) 108 (90 Base) MCG/ACT inhaler, DG Chest 2 View  Pain of left calf -Given strict precautions - Plan: VAS [place] LOWER EXTREMITY VENOUS (DVT)  F/u as needed  Abbe Amsterdam, MD

## 2020-08-14 NOTE — Patient Instructions (Signed)
Acute Bronchitis, Adult  Acute bronchitis is sudden or acute swelling of the air tubes (bronchi) in the lungs. Acute bronchitis causes these tubes to fill with mucus, which can make it hard to breathe. It can also cause coughing or wheezing. In adults, acute bronchitis usually goes away within 2 weeks. A cough caused by bronchitis may last up to 3 weeks. Smoking, allergies, and asthma can make the condition worse. What are the causes? This condition can be caused by germs and by substances that irritate the lungs, including:  Cold and flu viruses. The most common cause of this condition is the virus that causes the common cold.  Bacteria.  Substances that irritate the lungs, including: ? Smoke from cigarettes and other forms of tobacco. ? Dust and pollen. ? Fumes from chemical products, gases, or burned fuel. ? Other materials that pollute indoor or outdoor air.  Close contact with someone who has acute bronchitis. What increases the risk? The following factors may make you more likely to develop this condition:  A weak body's defense system, also called the immune system.  A condition that affects your lungs and breathing, such as asthma. What are the signs or symptoms? Common symptoms of this condition include:  Lung and breathing problems, such as: ? Coughing. This may bring up clear, yellow, or green mucus from your lungs (sputum). ? Wheezing. ? Having too much mucus in your lungs (chest congestion). ? Having shortness of breath.  A fever.  Chills.  Aches and pains, including: ? Tightness in your chest and other body aches. ? A sore throat. How is this diagnosed? This condition is usually diagnosed based on:  Your symptoms and medical history.  A physical exam. You may also have other tests, including tests to rule out other conditions, such pneumonia. These tests include:  A test of lung function.  Test of a mucus sample to look for the presence of  bacteria.  Tests to check the oxygen level in your blood.  Blood tests.  Chest X-ray. How is this treated? Most cases of acute bronchitis clear up over time without treatment. Your health care provider may recommend:  Drinking more fluids. This can thin your mucus, which may improve your breathing.  Taking a medicine for a fever or cough.  Using a device that gets medicine into your lungs (inhaler) to help improve breathing and control coughing.  Using a vaporizer or a humidifier. These are machines that add water to the air to help you breathe better. Follow these instructions at home: Activity  Get plenty of rest.  Return to your normal activities as told by your health care provider. Ask your health care provider what activities are safe for you. Lifestyle  Drink enough fluid to keep your urine pale yellow.  Do not drink alcohol.  Do not use any products that contain nicotine or tobacco, such as cigarettes, e-cigarettes, and chewing tobacco. If you need help quitting, ask your health care provider. Be aware that: ? Your bronchitis will get worse if you smoke or breathe in other people's smoke (secondhand smoke). ? Your lungs will heal faster if you quit smoking. General instructions   Take over-the-counter and prescription medicines only as told by your health care provider.  Use an inhaler, vaporizer, or humidifier as told by your health care provider.  If you have a sore throat, gargle with a salt-water mixture 3-4 times a day or as needed. To make a salt-water mixture, completely dissolve -1 tsp (3-6   g) of salt in 1 cup (237 mL) of warm water.  Keep all follow-up visits as told by your health care provider. This is important. How is this prevented? To lower your risk of getting this condition again:  Wash your hands often with soap and water. If soap and water are not available, use hand sanitizer.  Avoid contact with people who have cold symptoms.  Try not to  touch your mouth, nose, or eyes with your hands.  Avoid places where there are fumes from chemicals. Breathing these fumes will make your condition worse.  Get the flu shot every year. Contact a health care provider if:  Your symptoms do not improve after 2 weeks of treatment.  You vomit more than once or twice.  You have symptoms of dehydration such as: ? Dark urine. ? Dry skin or eyes. ? Increased thirst. ? Headaches. ? Confusion. ? Muscle cramps. Get help right away if you:  Cough up blood.  Feel pain in your chest.  Have severe shortness of breath.  Faint or keep feeling like you are going to faint.  Have a severe headache.  Have fever or chills that get worse. These symptoms may represent a serious problem that is an emergency. Do not wait to see if the symptoms will go away. Get medical help right away. Call your local emergency services (911 in the [place] Do not drive yourself to the hospital. Summary  Acute bronchitis is sudden (acute) inflammation of the air tubes (bronchi) between the windpipe and the lungs. In adults, acute bronchitis usually goes away within 2 weeks, although coughing may last 3 weeks or longer  Take over-the-counter and prescription medicines only as told by your health care provider.  Drink enough fluid to keep your urine pale yellow.  Contact a health care provider if your symptoms do not improve after 2 weeks of treatment.  Get help right away if you cough up blood, faint, or have chest pain or shortness of breath. This information is not intended to replace advice given to you by your health care provider. Make sure you discuss any questions you have with your health care provider. Document Revised: 04/09/2019 Document Reviewed: 02/16/2019 Elsevier Patient Education  2020 Else[employer]. Prevent the Spread of COVID-19 if You Are Sick If you are sick with COVID-19 or think you might have COVID-19, follow the steps below to care for  yourself and to help protect other people in your home and community. Stay home except to get medical care.  Stay home. Most people with COVID-19 have mild illness and are able to recover at home without medical care. Do not leave your home, except to get medical care. Do not visit public areas.  Take care of yourself. Get rest and stay hydrated. Take over-the-counter medicines, such as acetaminophen, to help you feel better.  Stay in touch with your doctor. Call before you get medical care. Be sure to get care if you have trouble breathing, or have any other emergency warning signs, or if you think it is an emergency.  Avoid public transportation, ride-sharing, or taxis. Separate yourself from other people and pets in your home.  As much as possible, stay in a specific room and away from other people and pets in your home. Also, you should use a separate bathroom, if available. If you need to be around other people or animals in or outside of the home, wear a mask. ? See COVID-19 and Animals if you have questions  about [employer]. ? Additional guidance is available for those living in close quarters. (http://www.turner-rogers.com/.html) and shared housing (TVStereos.ch). Monitor your symptoms.  Symptoms of COVID-19 include fever, cough, and shortness of breath but other symptoms may be present as well.  Follow care instructions from your healthcare provider and local health department. Your local health authorities will give instructions on checking your symptoms and reporting information. When to Seek Emergency Medical Attention Look for emergency warning signs* for COVID-19. If someone is showing any of these signs, seek emergency medical care immediately:  Trouble breathing  Persistent pain or pressure in the  chest  New confusion  Bluish lips or face  Inability to wake or stay awake *This list is not all possible symptoms. Please call your medical provider for any other symptoms that are severe or concerning to you. Call 911 or call ahead to your local emergency facility: Notify the operator that you are seeking care for someone who has or may have COVID-19. Call ahead before visiting your doctor.  Call ahead. Many medical visits for routine care are being postponed or done by phone or telemedicine.  If you have a medical appointment that cannot be postponed, call your doctor's office, and tell them you have or may have COVID-19. If you are sick, wear a mask over your nose and mouth.  You should wear a mask over your nose and mouth if you must be around other people or animals, including pets (even at home).  You don't need to wear the mask if you are alone. If you can't put on a mask (because of trouble breathing for example), cover your coughs and sneezes in some other way. Try to stay at least 6 feet away from other people. This will help protect the people around you.  Masks should not be placed on young children under age 27 years, anyone who has trouble breathing, or anyone who is not able to remove the mask without help. Note: During the COVID-19 pandemic, medical grade facemasks are reserved for healthcare workers and some first responders. You may need to make a mask using a scarf or bandana. Cover your coughs and sneezes.  Cover your mouth and nose with a tissue when you cough or sneeze.  Throw used tissues in a lined trash can.  Immediately wash your hands with soap and water for at least 20 seconds. If soap and water are not available, clean your hands with an alcohol-based hand sanitizer that contains at least 60% alcohol. Clean your hands often.  Wash your hands often with soap and water for at least 20 seconds. This is especially important after blowing your nose, coughing, or  sneezing; going to the bathroom; and before eating or preparing food.  Use hand sanitizer if soap and water are not available. Use an alcohol-based hand sanitizer with at least 60% alcohol, covering all surfaces of your hands and rubbing them together until they feel dry.  Soap and water are the best option, especially if your hands are visibly dirty.  Avoid touching your eyes, nose, and mouth with unwashed hands. Avoid sharing personal household items.  Do not share dishes, drinking glasses, cups, eating utensils, towels, or bedding with other people in your home.  Wash these items thoroughly after using them with soap and water or put them in the dishwasher. Clean all "high-touch" surfaces everyday.  Clean and disinfect high-touch surfaces in your "sick room" and bathroom. Let someone else clean and disinfect surfaces in common areas, but not  your bedroom and bathroom.  If a caregiver or other person needs to clean and disinfect a sick person's bedroom or bathroom, they should do so on an as-needed basis. The caregiver/other person should wear a mask and wait as long as possible after the sick person has used the bathroom. High-touch surfaces include phones, remote controls, counters, tabletops, doorknobs, bathroom fixtures, toilets, keyboards, tablets, and bedside tables.  Clean and disinfect areas that may have blood, stool, or body fluids on them.  Use household cleaners and disinfectants. Clean the area or item with soap and water or another detergent if it is dirty. Then use a household disinfectant. ? Be sure to follow the instructions on the label to ensure safe and effective use of the product. Many products recommend keeping the surface wet for several minutes to ensure germs are killed. Many also recommend precautions such as wearing gloves and making sure you have good ventilation during use of the product. ? Most EPA-registered household disinfectants should be effective. When  you can be around others after you had or likely had COVID-19 When you can be around others (end home isolation) depends on different factors for different situations.  I think or know I had COVID-19, and I had symptoms ? You can be with others after  24 hours with no fever AND  Symptoms improved AND  10 days since symptoms first appeared ? Depending on your healthcare provider's advice and availability of testing, you might get tested to see if you still have COVID-19. If you will be tested, you can be around others when you have no fever, symptoms have improved, and you receive two negative test results in a row, at least 24 hours apart.  I tested positive for COVID-19 but had no symptoms ? If you continue to have no symptoms, you can be with others after:  10 days have passed since test ? Depending on your healthcare provider's advice and availability of testing, you might get tested to see if you still have COVID-19. If you will be tested, you can be around others after you receive two negative test results in a row, at least 24 hours apart. ? If you develop symptoms after testing positive, follow the guidance above for "I think or know I had COVID, and I had symptoms." [employer] 03/20/2019 This information is not intended to replace advice given to you by your health care provider. Make sure you discuss any questions you have with your health care provider. Document Revised: 04/05/2019 Document Reviewed: 02/06/2019 Elsevier Patient Education  2020 [employer].

## 2020-08-22 ENCOUNTER — Encounter (HOSPITAL_COMMUNITY): Payer: Self-pay

## 2020-11-26 ENCOUNTER — Other Ambulatory Visit: Payer: Self-pay

## 2020-11-26 ENCOUNTER — Encounter: Payer: Self-pay | Admitting: Family Medicine

## 2020-11-26 ENCOUNTER — Ambulatory Visit (INDEPENDENT_AMBULATORY_CARE_PROVIDER_SITE_OTHER): Payer: 59 | Admitting: Family Medicine

## 2020-11-26 VITALS — BP 130/84 | HR 90 | Temp 97.9°F | Wt 243.4 lb

## 2020-11-26 DIAGNOSIS — M79661 Pain in right lower leg: Secondary | ICD-10-CM

## 2020-11-26 DIAGNOSIS — M79662 Pain in left lower leg: Secondary | ICD-10-CM

## 2020-11-26 DIAGNOSIS — R252 Cramp and spasm: Secondary | ICD-10-CM

## 2020-11-26 DIAGNOSIS — E559 Vitamin D deficiency, unspecified: Secondary | ICD-10-CM | POA: Diagnosis not present

## 2020-11-26 DIAGNOSIS — R030 Elevated blood-pressure reading, without diagnosis of hypertension: Secondary | ICD-10-CM

## 2020-11-26 DIAGNOSIS — F439 Reaction to severe stress, unspecified: Secondary | ICD-10-CM

## 2020-11-26 LAB — CBC WITH DIFFERENTIAL/PLATELET
Basophils Absolute: 0 10*3/uL (ref 0.0–0.1)
Basophils Relative: 0.8 % (ref 0.0–3.0)
Eosinophils Absolute: 0.8 10*3/uL — ABNORMAL HIGH (ref 0.0–0.7)
Eosinophils Relative: 12.8 % — ABNORMAL HIGH (ref 0.0–5.0)
HCT: 42.1 % (ref 36.0–46.0)
Hemoglobin: 14.2 g/dL (ref 12.0–15.0)
Lymphocytes Relative: 32 % (ref 12.0–46.0)
Lymphs Abs: 1.9 10*3/uL (ref 0.7–4.0)
MCHC: 33.7 g/dL (ref 30.0–36.0)
MCV: 85.6 fl (ref 78.0–100.0)
Monocytes Absolute: 0.4 10*3/uL (ref 0.1–1.0)
Monocytes Relative: 7.3 % (ref 3.0–12.0)
Neutro Abs: 2.8 10*3/uL (ref 1.4–7.7)
Neutrophils Relative %: 47.1 % (ref 43.0–77.0)
Platelets: 266 10*3/uL (ref 150.0–400.0)
RBC: 4.91 Mil/uL (ref 3.87–5.11)
RDW: 14.2 % (ref 11.5–15.5)
WBC: 5.9 10*3/uL (ref 4.0–10.5)

## 2020-11-26 LAB — COMPREHENSIVE METABOLIC PANEL
ALT: 19 U/L (ref 0–35)
AST: 15 U/L (ref 0–37)
Albumin: 4.2 g/dL (ref 3.5–5.2)
Alkaline Phosphatase: 86 U/L (ref 39–117)
BUN: 11 mg/dL (ref 6–23)
CO2: 28 mEq/L (ref 19–32)
Calcium: 9.6 mg/dL (ref 8.4–10.5)
Chloride: 102 mEq/L (ref 96–112)
Creatinine, Ser: 0.99 mg/dL (ref 0.40–1.20)
GFR: 74.63 mL/min (ref >60.00)
Glucose, Bld: 79 mg/dL (ref 70–99)
Potassium: 4.7 mEq/L (ref 3.5–5.1)
Sodium: 137 mEq/L (ref 135–145)
Total Bilirubin: 0.8 mg/dL (ref 0.2–1.2)
Total Protein: 7.4 g/dL (ref 6.0–8.3)

## 2020-11-26 LAB — FOLATE: Folate: >23.6 ng/mL (ref >5.9)

## 2020-11-26 LAB — T4, FREE: Free T4: 0.85 ng/dL (ref 0.60–1.60)

## 2020-11-26 LAB — VITAMIN B12: Vitamin B-12: 695 pg/mL (ref 211–911)

## 2020-11-26 LAB — VITAMIN D 25 HYDROXY (VIT D DEFICIENCY, FRACTURES): VITD: 34.62 ng/mL (ref 30.00–100.00)

## 2020-11-26 LAB — TSH: TSH: 0.97 u[IU]/mL (ref 0.35–4.50)

## 2020-11-26 NOTE — Progress Notes (Signed)
Subjective:    Patient ID: Lori Vaughn, female    DOB: 1985-10-28, 35 y.o.   MRN: [id number]  No chief complaint on file.   HPI Patient was seen today for acute concern.  Pt with bp elevation over the last few wks. pt notes BP 149/90, 136/96, 147/86, 130/85, 127/84 over the last week.  Patient also has a few episodes of chest pain that felt like cramping while laying down and again during the day.  Some increased stress dealing with family issues as a relatively new mom.  Patient also endorses eating out a little more than normal.  May have [employer] a few times per week.  Patient also notes weight gain.  Has a gym membership that she is not currently using.  May walk a few times per week depending on the weather.  Patient also notes bilateral calf pain, intermittent.  Patient requesting Doppler ultrasound as concerned she may have a DVT.  Patient denies SOB, bilateral calf or erythema.  Patient requesting echo.  Of note patient has area of ecchymosis on left upper chest where her daughter elbowed her.  Past Medical History:  Diagnosis Date  . Amenorrhea 01/28/2017  . Amenorrhea, secondary 01/28/2017  . Bacterial vaginitis 01/28/2017  . Elevated serum hCG 01/28/2017   HCG 26 on 01/28/17  . Migraines   . PCOS (polycystic ovarian syndrome)   . PCOS (polycystic ovarian syndrome)     Allergies  Allergen Reactions  . Molds & Smuts Other (See Comments)    Nasal polyps    ROS General: Denies fever, chills, night sweats, changes in weight, changes in appetite     HEENT: Denies  ear pain, changes in vision, rhinorrhea, sore throat + mild headache CV: Denies CP, palpitations, SOB, orthopnea   Pulm: Denies SOB, cough, wheezing GI: Denies abdominal pain, nausea, vomiting, diarrhea, constipation GU: Denies dysuria, hematuria, frequency, vaginal discharge Msk: Denies muscle cramps, joint pains+ bilateral calf pain, left chest cramp/discomfort Neuro: Denies weakness, numbness,  tingling Skin: Denies rashes, bruising Psych: Denies depression, anxiety, hallucinations      Objective:    Blood pressure 130/84, pulse 90, temperature 97.9 F (36.6 C), temperature source Oral, weight 243 lb 6.4 oz (110.4 kg), SpO2 98 %, currently breastfeeding.  Gen. Pleasant, well-nourished, in no distress, normal affect   HEENT: [place]/AT, face symmetric, conjunctiva clear, no scleral icterus, PERRLA, EOMI, nares patent without drainage Lungs: no accessory muscle use, CTAB, no wheezes or rales Cardiovascular: RRR, no m/r/g, no peripheral edema Musculoskeletal: Right medial calf pain.  Negative Homans' sign.  TTP of left upper chest at area of ecchymosis.  no deformities, no cyanosis or clubbing, normal tone Neuro:  A&Ox3, CN II-XII intact, normal gait Skin:  Warm, no lesions/ rash   Wt Readings from Last 3 Encounters:  11/26/20 243 lb 6.4 oz (110.4 kg)  08/14/20 244 lb (110.7 kg)  08/06/20 244 lb (110.7 kg)    Lab Results  Component Value Date   WBC 5.0 02/18/2020   HGB 13.2 02/18/2020   HCT 39.7 02/18/2020   PLT 242 02/18/2020   GLUCOSE 86 02/18/2020   ALT 26 02/18/2020   AST 20 02/18/2020   NA 139 02/18/2020   K 4.5 02/18/2020   CL 105 02/18/2020   CREATININE 1.00 02/18/2020   BUN 12 02/18/2020   CO2 26 02/18/2020   TSH 1.59 02/18/2020   HGBA1C 5.6 03/26/2020    Assessment/Plan:  Elevated blood pressure reading in office without diagnosis of hypertension -Discussed likely  causes including stress, weight gain, increased sodium intake -Patient encouraged to increase p.o. intake of water, decrease fast food intake, and increase physical activity -Discussed starting medication.  Patient declines at this time. -We will work on lifestyle medications -Reassess in 1 month, sooner if needed -Advised echo done on 02/2020 normal with normal EF.  We will wait on results of labs or proceeding with additional studies. - Plan: TSH, T4, Free, CBC with  Differential/Platelet  Cramp in muscle - Plan: CMP, Vitamin B12, Folate  Bilateral calf pain - Plan: D-dimer, Quantitative, VAS [place] LOWER EXTREMITY VENOUS (DVT)  Stress -Discussed self-care  Vitamin D deficiency - Plan: Vitamin D, 25-hydroxy  F/u in 1 month, sooner if needed  Abbe Amsterdam, MD

## 2020-11-26 NOTE — Patient Instructions (Addendum)
Your last ECHO was normal on 02/19/2020.  I would recommend getting your labs done first then if needed obtaining EKG and ECHO.  Preventing Hypertension Hypertension, also called high blood pressure, is when the force of blood pumping through the arteries is too strong. Arteries are blood vessels that carry blood from the heart throughout the body. Often, hypertension does not cause symptoms until blood pressure is very high. It is important to have your blood pressure checked regularly. Diet and lifestyle changes can help you prevent hypertension, and they may make you feel better overall and improve your quality of life. If you already have hypertension, you may control it with diet and lifestyle changes, as well as with medicine. How can this condition affect me? Over time, hypertension can damage the arteries and decrease blood flow to important parts of the body, including the brain, heart, and kidneys. By keeping your blood pressure in a healthy range, you can help prevent complications like heart attack, heart failure, stroke, kidney failure, and vascular dementia. What can increase my risk?  Being an older adult. Older people are more often affected.  Having family members who have had high blood pressure.  Being obese.  Being female. Males are more likely to have high blood pressure.  Drinking too much alcohol or caffeine.  Smoking or using illegal drugs.  Taking certain medicines, such as antidepressants, decongestants, birth control pills, and NSAIDs, such as ibuprofen.  Having thyroid problems.  Having certain tumors. What actions can I take to prevent or manage this condition? Work with your health care provider to make a hypertension prevention plan that works for you. Follow your plan and keep all follow-up visits as told by your health care provider. Diet changes Maintain a healthy diet. This includes:  Eating less salt (sodium). Ask your health care provider how much  sodium is safe for you to have. The general recommendation is to have less than 1 tsp (2,300 mg) of sodium a day. ? Do not add salt to your food. ? Choose low-sodium options when grocery shopping and eating out.  Limiting fats in your diet. You can do this by eating low-fat or fat-free dairy products and by eating less red meat.  Eating more fruits, vegetables, and whole grains. Make a goal to eat: ? 1-2 cups of fresh fruits and vegetables each day. ? 3-4 servings of whole grains each day.  Avoiding foods and beverages that have added sugars.  Eating fish that contain healthy fats (omega-3 fatty acids), such as mackerel or salmon. If you need help putting together a healthy eating plan, try the DASH diet. This diet is high in fruits, vegetables, and whole grains. It is low in sodium, red meat, and added sugars. DASH stands for Dietary Approaches to Stop Hypertension.   Lifestyle changes Lose weight if you are overweight. Losing just 3?5% of your body weight can help prevent or control hypertension. For example, if your present weight is 200 lb (91 kg), a loss of 3-5% of your weight means losing 6-10 lb (2.7-4.5 kg). Ask your health care provider to help you with a diet and exercise plan to safely lose weight. Other recommendations usually include:  Get enough exercise. Do at least 150 minutes of moderate-intensity exercise each week. You could do this in short exercise sessions several times a day, or you could do longer exercise sessions a few times a week. For example, you could take a brisk 10-minute walk or bike ride, 3 times a  day, for 5 days a week.  Find ways to reduce stress, such as exercising, meditating, listening to music, or taking a yoga class. If you need help reducing stress, ask your health care provider.  Do not use any products that contain nicotine or tobacco, such as cigarettes, e-cigarettes, and chewing tobacco. If you need help quitting, ask your health care provider.  Chemicals in tobacco and nicotine products raise your blood pressure each time you use them. If you need help quitting, ask your health care provider.  Learn how to check your blood pressure at home. Make sure that you know your personal target blood pressure, as told by your health care provider.  Try to sleep 7-9 hours per night.   Alcohol use  Do not drink alcohol if: ? Your health care provider tells you not to drink. ? You are pregnant, may be pregnant, or are planning to become pregnant.  If you drink alcohol: ? Limit how much you use to:  0-1 drink a day for women.  0-2 drinks a day for men. ? Be aware of how much alcohol is in your drink. In the [place] one drink equals one 12 oz bottle of beer (355 mL), one 5 oz glass of wine (148 mL), or one 1 oz glass of hard liquor (44 mL). Medicines In addition to diet and lifestyle changes, your health care provider may recommend medicines to help lower your blood pressure. In general:  You may need to try a few different medicines to find what works best for you.  You may need to take more than one medicine.  Take over-the-counter and prescription medicines only as told by your health care provider. Questions to ask your health care provider  What is my blood pressure goal?  How can I lower my risk for high blood pressure?  How should I monitor my blood pressure at home? Where to find support Your health care provider can help you prevent hypertension and help you keep your blood pressure at a healthy level. Your local hospital or your community may also provide support services and prevention programs. The American Heart Association offers an online support network at supportnetwork.heart.org Where to find more information Learn more about hypertension from:  [place], Lung, and [place]: https://wilson-eaton.com/  Centers for Disease Control and Prevention: http://www.wolf.info/  American Academy of Family Physicians:  familydoctor.org Learn more about the DASH diet from:  [place], Lung, and [place]: https://wilson-eaton.com/ Contact a health care provider if:  You think you are having a reaction to medicines you have taken.  You have recurrent headaches or feel dizzy.  You have swelling in your ankles.  You have trouble with your vision. Get help right away if:  You have sudden, severe chest, back, or abdominal pain or discomfort.  You have shortness of breath.  You have a sudden, severe headache. These symptoms may represent a serious problem that is an emergency. Do not wait to see if the symptoms will go away. Get medical help right away. Call your local emergency services (911 in the [place] Do not drive yourself to the hospital.  Summary  Hypertension often does not cause any symptoms until blood pressure is very high. It is important to get your blood pressure checked regularly.  Diet and lifestyle changes are important steps in preventing hypertension.  By keeping your blood pressure in a healthy range, you may prevent complications like heart attack, heart failure, stroke, and kidney failure.  Work with  your health care provider to make a hypertension prevention plan that works for you. This information is not intended to replace advice given to you by your health care provider. Make sure you discuss any questions you have with your health care provider. Document Revised: 06/26/2019 Document Reviewed: 06/26/2019 Elsevier Patient Education  2021 [place].  Muscle Cramps and Spasms Muscle cramps and spasms are when muscles tighten by themselves. They usually get better within minutes. Muscle cramps are painful. They are usually stronger and last longer than muscle spasms. Muscle spasms may or may not be painful. They can last a few seconds or much longer. Cramps and spasms can affect any muscle, but they occur most often in the calf muscles of the leg. They are usually not  caused by a serious problem. In many cases, the cause is not known. Some common causes include:  Doing more physical work or exercise than your body is ready for.  Using the muscles too much (overuse) by repeating certain movements too many times.  Staying in a certain position for a long time.  Playing a sport or doing an activity without preparing properly.  Using bad form or technique while playing a sport or doing an activity.  Not having enough water in your body (dehydration).  Injury.  Side effects of some medicines.  Low levels of the salts and minerals in your blood (electrolytes), such as low potassium or calcium. Follow these instructions at home: Managing pain and stiffness  Massage, stretch, and relax the muscle. Do this for many minutes at a time.  If told, put heat on tight or tense muscles as often as told by your doctor. Use the heat source that your doctor recommends, such as a moist heat pack or a heating pad. ? Place a towel between your skin and the heat source. ? Leave the heat on for 20-30 minutes. ? Remove the heat if your skin turns bright red. This is very important if you are not able to feel pain, heat, or cold. You may have a greater risk of getting burned.  If told, put ice on the affected area. This may help if you are sore or have pain after a cramp or spasm. ? Put ice in a plastic bag. ? Place a towel between your skin and the bag. ? Leave the ice on for 20 minutes, 2-3 times a day.  Try taking hot showers or baths to help relax tight muscles.      Eating and drinking  Drink enough fluid to keep your pee (urine) pale yellow.  Eat a healthy diet to help ensure that your muscles work well. This should include: ? Fruits and vegetables. ? Lean protein. ? Whole grains. ? Low-fat or nonfat dairy products. General instructions  If you are having cramps often, avoid intense exercise for several days.  Take over-the-counter and prescription  medicines only as told by your doctor.  Watch for any changes in your symptoms.  Keep all follow-up visits as told by your doctor. This is important. Contact a doctor if:  Your cramps or spasms get worse or happen more often.  Your cramps or spasms do not get better with time. Summary  Muscle cramps and spasms are when muscles tighten by themselves. They usually get better within minutes.  Cramps and spasms occur most often in the calf muscles of the leg.  Massage, stretch, and relax the muscle. This may help the cramp or spasm go away.  Drink enough  fluid to keep your pee (urine) pale yellow. This information is not intended to replace advice given to you by your health care provider. Make sure you discuss any questions you have with your health care provider. Document Revised: 12/19/2017 Document Reviewed: 12/19/2017 Elsevier Patient Education  2021 [place].  How to Take Your Blood Pressure Blood pressure measures how strongly your blood is pressing against the walls of your arteries. Arteries are blood vessels that carry blood from your heart throughout your body. You can take your blood pressure at home with a machine. You may need to check your blood pressure at home:  To check if you have high blood pressure (hypertension).  To check your blood pressure over time.  To make sure your blood pressure medicine is working. Supplies needed:  Blood pressure machine, or monitor.  Dining room chair to sit in.  Table or desk.  Small notebook.  Pencil or pen. How to prepare Avoid these things for 30 minutes before checking your blood pressure:  Having drinks with caffeine in them, such as coffee or tea.  Drinking alcohol.  Eating.  Smoking.  Exercising. Do these things five minutes before checking your blood pressure:  Go to the bathroom and pee (urinate).  Sit in a dining chair. Do not sit in a soft couch or an armchair.  Be quiet. Do not talk. How to  take your blood pressure Follow the instructions that came with your machine. If you have a digital blood pressure monitor, these may be the instructions: 1. Sit up straight. 2. Place your feet on the floor. Do not cross your ankles or legs. 3. Rest your left arm at the level of your heart. You may rest it on a table, desk, or chair. 4. Pull up your shirt sleeve. 5. Wrap the blood pressure cuff around the upper part of your left arm. The cuff should be 1 inch (2.5 cm) above your elbow. It is best to wrap the cuff around bare skin. 6. Fit the cuff snugly around your arm. You should be able to place only one finger between the cuff and your arm. 7. Place the cord so that it rests in the bend of your elbow. 8. Press the power button. 9. Sit quietly while the cuff fills with air and loses air. 10. Write down the numbers on the screen. 11. Wait 2-3 minutes and then repeat steps 1-10.   What do the numbers mean? Two numbers make up your blood pressure. The first number is called systolic pressure. The second is called diastolic pressure. An example of a blood pressure reading is "120 over 80" (or 120/80). If you are an adult and do not have a medical condition, use this guide to find out if your blood pressure is normal: Normal  First number: below 120.  Second number: below 80. Elevated  First number: 120-129.  Second number: below 80. Hypertension stage 1  First number: 130-139.  Second number: 80-89. Hypertension stage 2  First number: 140 or above.  Second number: 68 or above. Your blood pressure is above normal even if only the top or bottom number is above normal. Follow these instructions at home:  Check your blood pressure as often as your doctor tells you to.  Check your blood pressure at the same time every day.  Take your monitor to your next doctor's appointment. Your doctor will: ? Make sure you are using it correctly. ? Make sure it is working right.  Make  sure  you understand what your blood pressure numbers should be.  Tell your doctor if your medicine is causing side effects.  Keep all follow-up visits as told by your doctor. This is important. General tips:  You will need a blood pressure machine, or monitor. Your doctor can suggest a monitor. You can buy one at a drugstore or online. When choosing one: ? Choose one with an arm cuff. ? Choose one that wraps around your upper arm. Only one finger should fit between your arm and the cuff. ? Do not choose one that measures your blood pressure from your wrist or finger. Where to find more information American Heart Association: www.heart.org Contact a doctor if:  Your blood pressure keeps being high. Get help right away if:  Your first blood pressure number is higher than 180.  Your second blood pressure number is higher than 120. Summary  Check your blood pressure at the same time every day.  Avoid caffeine, alcohol, smoking, and exercise for 30 minutes before checking your blood pressure.  Make sure you understand what your blood pressure numbers should be. This information is not intended to replace advice given to you by your health care provider. Make sure you discuss any questions you have with your health care provider. Document Revised: 07/20/2019 Document Reviewed: 07/20/2019 Elsevier Patient Education  2021 [place], Adult Feeling a certain amount of stress is normal. Stress helps our body and mind get ready to deal with the demands of life. Stress hormones can motivate you to do well at work and meet your responsibilities. However severe or long-lasting (chronic) stress can affect your mental and physical health. Chronic stress puts you at higher risk for anxiety, depression, and other health problems like digestive problems, muscle aches, heart disease, high blood pressure, and stroke. What are the causes? Common causes of stress include:  Demands from  work, such as deadlines, feeling overworked, or having long hours.  Pressures at home, such as money issues, disagreements with a spouse, or parenting issues.  Pressures from major life changes, such as divorce, moving, loss of a loved one, or chronic illness. You may be at higher risk for stress-related problems if you do not get enough sleep, are in poor health, do not have emotional support, or have a mental health disorder like anxiety or depression. How to recognize stress Stress can make you:  Have trouble sleeping.  Feel sad, anxious, irritable, or overwhelmed.  Lose your appetite.  Overeat or want to eat unhealthy foods.  Want to use drugs or alcohol. Stress can also cause physical symptoms, such as:  Sore, tense muscles, especially in the shoulders and neck.  Headaches.  Trouble breathing.  A faster heart rate.  Stomach pain, nausea, or vomiting.  Diarrhea or constipation.  Trouble concentrating. Follow these instructions at home: Lifestyle  Identify the source of your stress and your reaction to it. See a therapist who can help you change your reactions.  When there are stressful events: ? Talk about it with family, friends, or co-workers. ? Try to think realistically about stressful events and not ignore them or overreact. ? Try to find the positives in a stressful situation and not focus on the negatives. ? Cut back on responsibilities at work and home, if possible. Ask for help from friends or family members if you need it.  Find ways to cope with stress, such as: ? Meditation. ? Deep breathing. ? Yoga or tai chi. ? Progressive  muscle relaxation. ? Doing art, playing music, or reading. ? Making time for fun activities. ? Spending time with family and friends.  Get support from family, friends, or spiritual resources. Eating and drinking  Eat a healthy diet. This includes: ? Eating foods that are high in fiber, such as beans, whole grains, and  fresh fruits and vegetables. ? Limiting foods that are high in fat and processed sugars, such as fried and sweet foods.  Do not skip meals or overeat.  Drink enough fluid to keep your urine pale yellow. Alcohol use  Do not drink alcohol if: ? Your health care provider tells you not to drink. ? You are pregnant, may be pregnant, or are planning to become pregnant.  Drinking alcohol is a way some people try to ease their stress. This can be dangerous, so if you drink alcohol: ? Limit how much you use to:  0-1 drink a day for women.  0-2 drinks a day for men. ? Be aware of how much alcohol is in your drink. In the [place] one drink equals one 12 oz bottle of beer (355 mL), one 5 oz glass of wine (148 mL), or one 1 oz glass of hard liquor (44 mL). Activity  Include 30 minutes of exercise in your daily schedule. Exercise is a good stress reducer.  Include time in your day for an activity that you find relaxing. Try taking a walk, going on a bike ride, reading a book, or listening to music.  Schedule your time in a way that lowers stress, and keep a consistent schedule. Prioritize what is most important to get done.   General instructions  Get enough sleep. Try to go to sleep and get up at about the same time every day.  Take over-the-counter and prescription medicines only as told by your health care provider.  Do not use any products that contain nicotine or tobacco, such as cigarettes, e-cigarettes, and chewing tobacco. If you need help quitting, ask your health care provider.  Do not use drugs or smoke to cope with stress.  Keep all follow-up visits as told by your health care provider. This is important. Where to find support  Talk with your health care provider about stress management or finding a support group.  Find a therapist to work with you on your stress management techniques. Contact a health care provider if:  Your stress symptoms get worse.  You are unable to  manage your stress at home.  You are struggling to stop using drugs or alcohol. Get help right away if:  You may be a danger to yourself or others.  You have any thoughts of death or suicide. If you ever feel like you may hurt yourself or others, or have thoughts about taking your own life, get help right away. You can go to your nearest emergency department or call:  Your local emergency services (911 in the [place]  A suicide crisis helpline, such as the [facility] at 937-778-0495. This is open 24 hours a day. Summary  Feeling a certain amount of stress is normal, but severe or long-lasting (chronic) stress can affect your mental and physical health.  Chronic stress can put you at higher risk for anxiety, depression, and other health problems like digestive problems, muscle aches, heart disease, high blood pressure, and stroke.  You may be at higher risk for stress-related problems if you do not get enough sleep, are in poor health, lack emotional support, or have  a mental health disorder like anxiety or depression.  Identify the source of your stress and your reaction to it. Try talking about stressful events with family, friends, or co-workers, finding a coping method, or getting support from spiritual resources.  If you need more help, talk with your health care provider about finding a support group or a mental health therapist. This information is not intended to replace advice given to you by your health care provider. Make sure you discuss any questions you have with your health care provider. Document Revised: 02/21/2019 Document Reviewed: 02/21/2019 Elsevier Patient Education  [place], Adult After being diagnosed with an anxiety disorder, you may be relieved to know why you have felt or behaved a certain way. You may also feel overwhelmed about the treatment ahead and what it will mean for your life. With care and  support, you can manage this condition and recover from it. How to manage lifestyle changes Managing stress and anxiety Stress is your body's reaction to life changes and events, both good and bad. Most stress will last just a few hours, but stress can be ongoing and can lead to more than just stress. Although stress can play a major role in anxiety, it is not the same as anxiety. Stress is usually caused by something external, such as a deadline, test, or competition. Stress normally passes after the triggering event has ended.  Anxiety is caused by something internal, such as imagining a terrible outcome or worrying that something will go wrong that will devastate you. Anxiety often does not go away even after the triggering event is over, and it can become long-term (chronic) worry. It is important to understand the differences between stress and anxiety and to manage your stress effectively so that it does not lead to an anxious response. Talk with your health care provider or a counselor to learn more about reducing anxiety and stress. He or she may suggest tension reduction techniques, such as:  Music therapy. This can include creating or listening to music that you enjoy and that inspires you.  Mindfulness-based meditation. This involves being aware of your normal breaths while not trying to control your breathing. It can be done while sitting or walking.  Centering prayer. This involves focusing on a word, phrase, or sacred image that means something to you and brings you peace.  Deep breathing. To do this, expand your stomach and inhale slowly through your nose. Hold your breath for 3-5 seconds. Then exhale slowly, letting your stomach muscles relax.  Self-talk. This involves identifying thought patterns that lead to anxiety reactions and changing those patterns.  Muscle relaxation. This involves tensing muscles and then relaxing them. Choose a tension reduction technique that suits your  lifestyle and personality. These techniques take time and practice. Set aside 5-15 minutes a day to do them. Therapists can offer counseling and training in these techniques. The training to help with anxiety may be covered by some insurance plans. Other things you can do to manage stress and anxiety include:  Keeping a stress/anxiety diary. This can help you learn what triggers your reaction and then learn ways to manage your response.  Thinking about how you react to certain situations. You may not be able to control everything, but you can control your response.  Making time for activities that help you relax and not feeling guilty about spending your time in this way.  Visual imagery and yoga can help you stay calm and  relax.   Medicines Medicines can help ease symptoms. Medicines for anxiety include:  Anti-anxiety drugs.  Antidepressants. Medicines are often used as a primary treatment for anxiety disorder. Medicines will be prescribed by a health care provider. When used together, medicines, psychotherapy, and tension reduction techniques may be the most effective treatment. Relationships Relationships can play a big part in helping you recover. Try to spend more time connecting with trusted friends and family members. Consider going to couples counseling, taking family education classes, or going to family therapy. Therapy can help you and others better understand your condition. How to recognize changes in your anxiety Everyone responds differently to treatment for anxiety. Recovery from anxiety happens when symptoms decrease and stop interfering with your daily activities at home or work. This may mean that you will start to:  Have better concentration and focus. Worry will interfere less in your daily thinking.  Sleep better.  Be less irritable.  Have more energy.  Have improved memory. It is important to recognize when your condition is getting worse. Contact your health care  provider if your symptoms interfere with home or work and you feel like your condition is not improving. Follow these instructions at home: Activity  Exercise. Most adults should do the following: ? Exercise for at least 150 minutes each week. The exercise should increase your heart rate and make you sweat (moderate-intensity exercise). ? Strengthening exercises at least twice a week.  Get the right amount and quality of sleep. Most adults need 7-9 hours of sleep each night. Lifestyle  Eat a healthy diet that includes plenty of vegetables, fruits, whole grains, low-fat dairy products, and lean protein. Do not eat a lot of foods that are high in solid fats, added sugars, or salt.  Make choices that simplify your life.  Do not use any products that contain nicotine or tobacco, such as cigarettes, e-cigarettes, and chewing tobacco. If you need help quitting, ask your health care provider.  Avoid caffeine, alcohol, and certain over-the-counter cold medicines. These may make you feel worse. Ask your pharmacist which medicines to avoid.   General instructions  Take over-the-counter and prescription medicines only as told by your health care provider.  Keep all follow-up visits as told by your health care provider. This is important. Where to find support You can get help and support from these sources:  Self-help groups.  Online and [employer].  A trusted spiritual leader.  Couples counseling.  Family education classes.  Family therapy. Where to find more information You may find that joining a support group helps you deal with your anxiety. The following sources can help you locate counselors or support groups near you:  [place]: www.mentalhealth[place].net  Anxiety and Depression Association of [place] (ADAA): https://www.clark.net/  National Alliance on Mental Illness (NAMI): www.nami.org Contact a health care provider if you:  Have a hard time staying  focused or finishing daily tasks.  Spend many hours a day feeling worried about everyday life.  Become exhausted by worry.  Start to have headaches, feel tense, or have nausea.  Urinate more than normal.  Have diarrhea. Get help right away if you have:  A racing heart and shortness of breath.  Thoughts of hurting yourself or others. If you ever feel like you may hurt yourself or others, or have thoughts about taking your own life, get help right away. You can go to your nearest emergency department or call:  Your local emergency services (911 in the [place]  A suicide crisis helpline, such as the [place] at (431)183-1621. This is open 24 hours a day. Summary  Taking steps to learn and use tension reduction techniques can help calm you and help prevent triggering an anxiety reaction.  When used together, medicines, psychotherapy, and tension reduction techniques may be the most effective treatment.  Family, friends, and partners can play a big part in helping you recover from an anxiety disorder. This information is not intended to replace advice given to you by your health care provider. Make sure you discuss any questions you have with your health care provider. Document Revised: 12/26/2018 Document Reviewed: 12/26/2018 Elsevier Patient Education  [place].

## 2020-11-27 ENCOUNTER — Encounter: Payer: Self-pay | Admitting: Family Medicine

## 2020-11-27 LAB — D-DIMER, QUANTITATIVE: D-Dimer, Quant: 0.44 mcg/mL FEU (ref <0.50)

## 2020-11-28 NOTE — Progress Notes (Signed)
Patient viewed results on MyChart. 

## 2020-11-28 NOTE — Telephone Encounter (Signed)
Patient would like a response back from Dr. Salomon Fick today please.

## 2020-12-01 ENCOUNTER — Emergency Department (HOSPITAL_BASED_OUTPATIENT_CLINIC_OR_DEPARTMENT_OTHER): Payer: 59 | Admitting: Radiology

## 2020-12-01 ENCOUNTER — Emergency Department (HOSPITAL_BASED_OUTPATIENT_CLINIC_OR_DEPARTMENT_OTHER)
Admission: EM | Admit: 2020-12-01 | Discharge: 2020-12-01 | Disposition: A | Discharge status: Home / Self Care | Payer: 59 | Attending: Emergency Medicine | Admitting: Emergency Medicine

## 2020-12-01 ENCOUNTER — Encounter (HOSPITAL_BASED_OUTPATIENT_CLINIC_OR_DEPARTMENT_OTHER): Payer: Self-pay | Admitting: Emergency Medicine

## 2020-12-01 ENCOUNTER — Other Ambulatory Visit: Payer: Self-pay

## 2020-12-01 DIAGNOSIS — R079 Chest pain, unspecified: Secondary | ICD-10-CM | POA: Diagnosis present

## 2020-12-01 LAB — BASIC METABOLIC PANEL
Anion gap: 3 — ABNORMAL LOW (ref 5–15)
BUN: 16 mg/dL (ref 6–20)
CO2: 32 mmol/L (ref 22–32)
Calcium: 9.8 mg/dL (ref 8.9–10.3)
Chloride: 103 mmol/L (ref 98–111)
Creatinine, Ser: 1.17 mg/dL — ABNORMAL HIGH (ref 0.44–1.00)
GFR, Estimated: >60 mL/min (ref >60)
Glucose, Bld: 89 mg/dL (ref 70–99)
Potassium: 4.1 mmol/L (ref 3.5–5.1)
Sodium: 138 mmol/L (ref 135–145)

## 2020-12-01 LAB — CBC
HCT: 40 % (ref 36.0–46.0)
Hemoglobin: 13.6 g/dL (ref 12.0–15.0)
MCH: 28.4 pg (ref 26.0–34.0)
MCHC: 34 g/dL (ref 30.0–36.0)
MCV: 83.5 fL (ref 80.0–100.0)
Platelets: 288 10*3/uL (ref 150–400)
RBC: 4.79 MIL/uL (ref 3.87–5.11)
RDW: 13.2 % (ref 11.5–15.5)
WBC: 6.6 10*3/uL (ref 4.0–10.5)
nRBC: 0 % (ref 0.0–0.2)

## 2020-12-01 LAB — TROPONIN I (HIGH SENSITIVITY): Troponin I (High Sensitivity): <2 ng/L (ref <18)

## 2020-12-01 LAB — PREGNANCY, URINE: Preg Test, Ur: NEGATIVE

## 2020-12-01 NOTE — ED Triage Notes (Addendum)
L side chest pain radiating into both arms x1 week. Saw PCP and had blood work. Pain persists and is intermittent.

## 2020-12-01 NOTE — Discharge Instructions (Signed)
Recommend following up with your primary doctor.  If your chest pain is worsening you develop difficulty breathing or other new concerning symptoms, please return to ER for reassessment.

## 2020-12-01 NOTE — ED Provider Notes (Signed)
[facility] EMERGENCY DEPT Provider Note   CSN: [id number] Arrival date & time: 12/01/20  1620     History Chief Complaint  Patient presents with  . Chest Pain    [name] is a 35 y.o. female.  Patient reports that she has been having intermittent chest pain over the past week or so.  States that it radiates down both of her arms at times.  Currently pain is mild.  States that pain normally occurs at rest, not associated with exertion, dull and achy, mild to moderate.  Does not radiate to back.  No identified alleviating or aggravating factors.  Denies prior history of DVT/PE, prior history of CAD.  Denies tobacco abuse.  HPI     Past Medical History:  Diagnosis Date  . Amenorrhea 01/28/2017  . Amenorrhea, secondary 01/28/2017  . Bacterial vaginitis 01/28/2017  . Elevated serum hCG 01/28/2017   HCG 26 on 01/28/17  . Migraines   . PCOS (polycystic ovarian syndrome)   . PCOS (polycystic ovarian syndrome)     Patient Active Problem List   Diagnosis Date Noted  . Vitamin D deficiency 02/20/2020  . Sickle cell trait (HCC) 02/26/2019  . [redacted] weeks gestation of pregnancy 02/26/2019  . Pes planus 12/22/2018  . 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 History of migraine 08/29/2018  . Amenorrhea 01/28/2017  . Bacterial vaginitis 01/28/2017  . Elevated serum hCG 01/28/2017  . PCOS (polycystic ovarian syndrome) 01/30/2016    Past Surgical History:  Procedure Laterality Date  . NASAL SINUS SURGERY       OB History    Gravida  2   Para  0   Term  0   Preterm  0   AB  1   Living  0     SAB  0   IAB  1   Ectopic  0   Multiple  0   Live Births              Family History  Problem Relation Age of Onset  . Diabetes Father   . Hypertension Father   . Cystic fibrosis Father   . Sickle cell trait Father   . Diabetes Maternal Grandmother   . Diabetes Paternal Grandmother   . Bipolar disorder Mother   . Depression  Mother   . Healthy Sister     Social History   Tobacco Use  . Smoking status: Never Smoker  . Smokeless tobacco: Never Used  Vaping Use  . Vaping Use: Never used  Substance Use Topics  . Alcohol use: Yes    Comment: Occasionally  . Drug use: Yes    Types: Marijuana    Home Medications Prior to Admission medications   Medication Sig Start Date End Date Taking? Authorizing Provider  albuterol (VENTOLIN HFA) 108 (90 Base) MCG/ACT inhaler Inhale 2 puffs into the lungs every 6 (six) hours as needed for wheezing or shortness of breath. Patient not taking: Reported on 11/26/2020 08/14/20   Deeann Saint, MD  Multiple Vitamins-Minerals (MULTIVITAMIN ADULTS PO) Take by mouth.    [provider]    Allergies    Molds & smuts  Review of Systems   Review of Systems  Constitutional: Negative for chills and fever.  HENT: Negative for ear pain and sore throat.   Eyes: Negative for pain and visual disturbance.  Respiratory: Positive for chest tightness. Negative for cough and shortness of breath.   Cardiovascular: Positive for chest pain. Negative  for palpitations.  Gastrointestinal: Negative for abdominal pain and vomiting.  Genitourinary: Negative for dysuria and hematuria.  Musculoskeletal: Negative for arthralgias and back pain.  Skin: Negative for color change and rash.  Neurological: Negative for seizures and syncope.  All other systems reviewed and are negative.   Physical Exam Updated Vital Signs BP 113/72 (BP Location: Right Arm)   Pulse (!) 55   Temp 98.3 F (36.8 C) (Oral)   Resp 20   Ht 5\' 11"  (1.803 m)   Wt 108.9 kg   LMP 10/31/2020   SpO2 100%   BMI 33.47 kg/m   Physical Exam Vitals and nursing note reviewed.  Constitutional:      General: She is not in acute distress.    Appearance: She is well-developed.  HENT:     Head: Normocephalic and atraumatic.  Eyes:     Conjunctiva/sclera: Conjunctivae normal.  Cardiovascular:     Rate and Rhythm:  Normal rate and regular rhythm.     Heart sounds: No murmur heard.   Pulmonary:     Effort: Pulmonary effort is normal. No respiratory distress.     Breath sounds: Normal breath sounds.  Abdominal:     Palpations: Abdomen is soft.     Tenderness: There is no abdominal tenderness.  Musculoskeletal:     Cervical back: Neck supple.  Skin:    General: Skin is warm and dry.  Neurological:     Mental Status: She is alert.     Comments: Normal strength and sensation in her upper extremities     ED Results / Procedures / Treatments   Labs (all labs ordered are listed, but only abnormal results are displayed) Labs Reviewed  BASIC METABOLIC PANEL - Abnormal; Notable for the following components:      Result Value   Creatinine, Ser 1.17 (*)    Anion gap 3 (*)    All other components within normal limits  CBC  PREGNANCY, URINE  TROPONIN I (HIGH SENSITIVITY)    EKG EKG Interpretation  Date/Time:  Monday December 01 2020 16:30:23 EDT Ventricular Rate:  67 PR Interval:  144 QRS Duration: 82 QT Interval:  398 QTC Calculation: 420 R Axis:   86 Text Interpretation: Normal sinus rhythm Normal ECG Confirmed by [id number] (Marianna Fuss) on 12/01/2020 5:17:16 PM   Radiology DG Chest 2 View  Result Date: 12/01/2020 CLINICAL DATA:  Left chest pain x1 week EXAM: CHEST - 2 VIEW COMPARISON:  08/14/2020 FINDINGS: Lungs are clear.  No pleural effusion or pneumothorax. The heart is normal in size. Visualized osseous structures are within normal limits. IMPRESSION: Normal chest radiographs. Electronically Signed   By: 10/12/2020 M.D.   On: 12/01/2020 17:02    Procedures Procedures   Medications Ordered in ED Medications - No data to display  ED Course  I have reviewed the triage vital signs and the nursing notes.  Pertinent labs & imaging results that were available during my care of the patient were reviewed by me and considered in my medical decision making (see chart for  details).    MDM Rules/Calculators/A&P                         34 year old lady presenting to ER with concern for intermittent chest pain over the past week.  On exam patient noted to be remarkably well-appearing in no distress with stable vital signs.  EKG without acute ischemic change and her troponin is undetectable.  Doubt ACS.  CXR negative.  Given no hypoxia, tachycardia, tachypnea, doubt PE.  Given the reassuring work-up today and her current clinical appearance but she is appropriate for discharge and outpatient management.  I recommend she follow-up with her doctor to discuss these symptoms further.  After the discussed management above, the patient was determined to be safe for discharge.  The patient was in agreement with this plan and all questions regarding their care were answered.  ED return precautions were discussed and the patient will return to the ED with any significant worsening of condition.    Final Clinical Impression(s) / ED Diagnoses Final diagnoses:  Chest pain, unspecified type    Rx / DC Orders ED Discharge Orders    None       Milagros Loll, MD 12/02/20 1454

## 2020-12-02 ENCOUNTER — Ambulatory Visit (HOSPITAL_COMMUNITY)
Admission: RE | Admit: 2020-12-02 | Discharge: 2020-12-02 | Disposition: A | Discharge status: Home / Self Care | Payer: 59 | Source: Ambulatory Visit | Attending: Cardiovascular Disease | Admitting: Cardiovascular Disease

## 2020-12-02 DIAGNOSIS — M79661 Pain in right lower leg: Secondary | ICD-10-CM | POA: Diagnosis present

## 2020-12-02 DIAGNOSIS — M79662 Pain in left lower leg: Secondary | ICD-10-CM | POA: Diagnosis not present

## 2020-12-16 NOTE — Progress Notes (Signed)
Patient viewed results on MyChart. 

## 2021-04-29 ENCOUNTER — Telehealth: Payer: 59 | Admitting: Internal Medicine

## 2021-05-18 ENCOUNTER — Ambulatory Visit (HOSPITAL_BASED_OUTPATIENT_CLINIC_OR_DEPARTMENT_OTHER)
Admission: RE | Admit: 2021-05-18 | Discharge: 2021-05-18 | Disposition: A | Discharge status: Home / Self Care | Payer: 59 | Source: Ambulatory Visit | Attending: Emergency Medicine | Admitting: Emergency Medicine

## 2021-05-18 ENCOUNTER — Other Ambulatory Visit: Payer: Self-pay

## 2021-05-18 ENCOUNTER — Ambulatory Visit
Admission: EM | Admit: 2021-05-18 | Discharge: 2021-05-18 | Disposition: A | Discharge status: Home / Self Care | Payer: 59 | Attending: Emergency Medicine | Admitting: Emergency Medicine

## 2021-05-18 DIAGNOSIS — R042 Hemoptysis: Secondary | ICD-10-CM | POA: Insufficient documentation

## 2021-05-18 DIAGNOSIS — J209 Acute bronchitis, unspecified: Secondary | ICD-10-CM

## 2021-05-18 DIAGNOSIS — J40 Bronchitis, not specified as acute or chronic: Secondary | ICD-10-CM

## 2021-05-18 DIAGNOSIS — R058 Other specified cough: Secondary | ICD-10-CM | POA: Diagnosis not present

## 2021-05-18 MED ORDER — ALBUTEROL SULFATE HFA 108 (90 BASE) MCG/ACT IN AERS: 2.0000 | INHALATION_SPRAY | Freq: Four times a day (QID) | RESPIRATORY_TRACT | 0 refills | Status: DC | PRN

## 2021-05-18 MED ORDER — AMOXICILLIN-POT CLAVULANATE 875-125 MG PO TABS: 1.0000 | ORAL_TABLET | Freq: Two times a day (BID) | ORAL | 0 refills | Status: AC
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
## 2021-05-18 NOTE — Discharge Instructions (Addendum)
Oral steroids are not safe to take while breast-feeding is known wrist 2 implants cannot be ruled out.  It is safe to take amoxicillin clavulanate, as we discussed, you can pass this medicine 3 breast milk to the baby but it is considered safe to take and the side effects to the baby are limited to mild diarrhea.  I recommend that because you cannot use steroids that you begin to use albuterol inhaler 2 puffs 4 times daily for the next few days to open up your airways and hopefully help calm them down.  Please follow-up with your primary care physician if you not have relief within the next 3 to 5 days.  You will be notified of the results of your x-ray once they are received.
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
## 2021-06-04 ENCOUNTER — Emergency Department (HOSPITAL_BASED_OUTPATIENT_CLINIC_OR_DEPARTMENT_OTHER)
Admission: EM | Admit: 2021-06-04 | Discharge: 2021-06-04 | Disposition: A | Discharge status: Home / Self Care | Payer: 59 | Attending: Emergency Medicine | Admitting: Emergency Medicine

## 2021-06-04 ENCOUNTER — Encounter (HOSPITAL_BASED_OUTPATIENT_CLINIC_OR_DEPARTMENT_OTHER): Payer: Self-pay

## 2021-06-04 ENCOUNTER — Emergency Department (HOSPITAL_BASED_OUTPATIENT_CLINIC_OR_DEPARTMENT_OTHER): Payer: 59 | Admitting: Radiology

## 2021-06-04 DIAGNOSIS — R0602 Shortness of breath: Secondary | ICD-10-CM | POA: Insufficient documentation

## 2021-06-04 DIAGNOSIS — R0981 Nasal congestion: Secondary | ICD-10-CM | POA: Insufficient documentation

## 2021-06-04 DIAGNOSIS — J3489 Other specified disorders of nose and nasal sinuses: Secondary | ICD-10-CM | POA: Insufficient documentation

## 2021-06-04 DIAGNOSIS — R051 Acute cough: Secondary | ICD-10-CM | POA: Diagnosis not present

## 2021-06-04 DIAGNOSIS — M79662 Pain in left lower leg: Secondary | ICD-10-CM | POA: Insufficient documentation

## 2021-06-04 LAB — CBC WITH DIFFERENTIAL/PLATELET
Abs Immature Granulocytes: 0.01 10*3/uL (ref 0.00–0.07)
Basophils Absolute: 0 10*3/uL (ref 0.0–0.1)
Basophils Relative: 1 %
Eosinophils Absolute: 0.5 10*3/uL (ref 0.0–0.5)
Eosinophils Relative: 7 %
HCT: 35.1 % — ABNORMAL LOW (ref 36.0–46.0)
Hemoglobin: 12 g/dL (ref 12.0–15.0)
Immature Granulocytes: 0 %
Lymphocytes Relative: 42 %
Lymphs Abs: 3.2 10*3/uL (ref 0.7–4.0)
MCH: 28.1 pg (ref 26.0–34.0)
MCHC: 34.2 g/dL (ref 30.0–36.0)
MCV: 82.2 fL (ref 80.0–100.0)
Monocytes Absolute: 0.5 10*3/uL (ref 0.1–1.0)
Monocytes Relative: 7 %
Neutro Abs: 3.2 10*3/uL (ref 1.7–7.7)
Neutrophils Relative %: 43 %
Platelets: 267 10*3/uL (ref 150–400)
RBC: 4.27 MIL/uL (ref 3.87–5.11)
RDW: 13.2 % (ref 11.5–15.5)
WBC: 7.5 10*3/uL (ref 4.0–10.5)
nRBC: 0 % (ref 0.0–0.2)

## 2021-06-04 LAB — COMPREHENSIVE METABOLIC PANEL
ALT: 15 U/L (ref 0–44)
AST: 13 U/L — ABNORMAL LOW (ref 15–41)
Albumin: 3.7 g/dL (ref 3.5–5.0)
Alkaline Phosphatase: 65 U/L (ref 38–126)
Anion gap: 6 (ref 5–15)
BUN: 15 mg/dL (ref 6–20)
CO2: 26 mmol/L (ref 22–32)
Calcium: 9.2 mg/dL (ref 8.9–10.3)
Chloride: 105 mmol/L (ref 98–111)
Creatinine, Ser: 0.93 mg/dL (ref 0.44–1.00)
GFR, Estimated: >60 mL/min (ref >60)
Glucose, Bld: 93 mg/dL (ref 70–99)
Potassium: 3.9 mmol/L (ref 3.5–5.1)
Sodium: 137 mmol/L (ref 135–145)
Total Bilirubin: 0.5 mg/dL (ref 0.3–1.2)
Total Protein: 6.7 g/dL (ref 6.5–8.1)

## 2021-06-04 LAB — D-DIMER, QUANTITATIVE: D-Dimer, Quant: 0.43 ug/mL-FEU (ref 0.00–0.50)

## 2021-06-04 NOTE — ED Provider Notes (Signed)
[facility] EMERGENCY DEPT Provider Note  CSN: [id number] Arrival date & time: 06/04/21 0257  Chief Complaint(s) Cough, Shortness of Breath, and Leg Pain  HPI Lori Vaughn is a 35 y.o. female    Cough Cough characteristics:  Dry and hacking Sputum characteristics:  Nondescript Severity:  Moderate Onset quality:  Gradual Duration:  5 days Timing:  Intermittent Progression:  Waxing and waning Chronicity:  New Context: sick contacts (daughter had RSV last week)   Relieved by:  Nothing Worsened by:  Nothing Associated symptoms: rhinorrhea, shortness of breath and sinus congestion   Associated symptoms: no chest pain, no fever, no myalgias and no rash   Shortness of Breath Severity:  Mild Timing:  Intermittent Progression:  Resolved Associated symptoms: cough   Associated symptoms: no chest pain, no fever and no rash   Leg Pain Location:  Leg Leg location:  L lower leg Pain details:    Quality:  Aching   Radiates to:  Does not radiate   Severity:  Mild   Onset quality:  Sudden   Duration:  4 hours   Timing:  Constant   Progression:  Resolved Chronicity:  New Relieved by:  Nothing Worsened by:  Nothing Associated symptoms: no fever   Risk factors comment:  Started on flight back from [place]  Past Medical History Past Medical History:  Diagnosis Date   Amenorrhea 01/28/2017   Amenorrhea, secondary 01/28/2017   Bacterial vaginitis 01/28/2017   Elevated serum hCG 01/28/2017   HCG 26 on 01/28/17   Migraines    PCOS (polycystic ovarian syndrome)    PCOS (polycystic ovarian syndrome)    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 Authorizing Provider  albuterol (VENTOLIN HFA) 108 (90 Base) MCG/ACT inhaler Inhale 2 puffs into the lungs every 6 (six) hours as needed for wheezing or shortness of breath. 05/18/21   Theadora Rama Scales, PA-C  Multiple Vitamins-Minerals (MULTIVITAMIN ADULTS PO) Take by mouth.    [provider]                                                                                                                                    Past Surgical History Past Surgical History:  Procedure Laterality Date   NASAL SINUS SURGERY     Family History Family History  Problem Relation Age of Onset   Diabetes Father    Hypertension Father    Cystic fibrosis Father    Sickle  cell trait Father    Diabetes Maternal Grandmother    Diabetes Paternal Grandmother    Bipolar disorder Mother    Depression Mother    Healthy Sister     Social History Social History   Tobacco Use   Smoking status: Never   Smokeless tobacco: Never  Vaping Use   Vaping Use: Never used  Substance Use Topics   Alcohol use: Yes    Comment: Occasionally   Drug use: Yes    Types: Marijuana   Allergies Molds & smuts  Review of Systems Review of Systems  Constitutional:  Negative for fever.  HENT:  Positive for rhinorrhea.   Respiratory:  Positive for cough and shortness of breath.   Cardiovascular:  Negative for chest pain.  Musculoskeletal:  Negative for myalgias.  Skin:  Negative for rash.  All other systems are reviewed and are negative for acute change except as noted in the HPI  Physical Exam Vital Signs  I have reviewed the triage vital signs BP 123/82   Pulse 64   Temp 98.4 F (36.9 C) (Oral)   Resp 18   Ht 6' (1.829 m)   Wt 107 kg   LMP 05/29/2021   SpO2 100%   BMI 32.01 kg/m   Physical Exam Vitals reviewed.  Constitutional:       General: She is not in acute distress.    Appearance: She is well-developed. She is not diaphoretic.  HENT:     Head: Normocephalic and atraumatic.     Right Ear: Tympanic membrane normal.     Left Ear: Tympanic membrane normal.     Nose: Nose normal.     Mouth/Throat:     Tongue: No lesions.     Pharynx: No posterior oropharyngeal erythema.     Tonsils: No tonsillar exudate.  Eyes:     General: No scleral icterus.       Right eye: No discharge.        Left eye: No discharge.     Conjunctiva/sclera: Conjunctivae normal.     Pupils: Pupils are equal, round, and reactive to light.  Cardiovascular:     Rate and Rhythm: Normal rate and regular rhythm.     Heart sounds: No murmur heard.   No friction rub. No gallop.  Pulmonary:     Effort: Pulmonary effort is normal. No respiratory distress.     Breath sounds: Normal breath sounds. No stridor. No rales.  Abdominal:     General: There is no distension.     Palpations: Abdomen is soft.     Tenderness: There is no abdominal tenderness.  Musculoskeletal:        General: No tenderness.     Cervical back: Normal range of motion and neck supple.     Right lower leg: No swelling or tenderness. No edema.     Left lower leg: No swelling or tenderness. No edema.  Skin:    General: Skin is warm and dry.     Findings: No erythema or rash.  Neurological:     Mental Status: She is alert and oriented to person, place, and time.    ED Results and Treatments Labs (all labs ordered are listed, but only abnormal results are displayed) Labs Reviewed  CBC WITH DIFFERENTIAL/PLATELET - Abnormal; Notable for the following components:      Result Value   HCT 35.1 (*)    All other components within normal limits  COMPREHENSIVE METABOLIC PANEL - Abnormal; Notable for the following  components:   AST 13 (*)    All other components within normal limits  D-DIMER, QUANTITATIVE                                                                                                                          EKG  EKG Interpretation  Date/Time:  Thursday June 04 2021 04:06:52 EDT Ventricular Rate:  61 PR Interval:  151 QRS Duration: 91 QT Interval:  431 QTC Calculation: 435 R Axis:   72 Text Interpretation: Sinus rhythm Borderline T wave abnormalities No acute changes Confirmed by Drema Pry [phone]) on 06/04/2021 4:31:16 AM       Radiology DG Chest 2 View  Result Date: 06/04/2021 CLINICAL DATA:  Cough. EXAM: CHEST - 2 VIEW COMPARISON:  May 18, 2021 FINDINGS: The heart size and mediastinal contours are within normal limits. Both lungs are clear. The visualized skeletal structures are unremarkable. IMPRESSION: No active cardiopulmonary disease. Electronically Signed   By: Aram Candela M.D.   On: 06/04/2021 04:00    Pertinent labs & imaging results that were available during my care of the patient were reviewed by me and considered in my medical decision making (see MDM for details).  Medications Ordered in ED Medications - No data to display                                                                                                                                   Procedures Procedures  (including critical care time)  Medical Decision Making / ED Course I have reviewed the nursing notes for this encounter and the patient's prior records (if available in EHR or on provided paperwork).  Lori Vaughn was evaluated in Emergency Department on 06/04/2021 for the symptoms described in the history of present illness. She was evaluated in the context of the global COVID-19 pandemic, which necessitated consideration that the patient might be at risk for infection with the SARS-CoV-2 virus that causes COVID-19. Institutional protocols and algorithms that pertain to the evaluation of patients at risk for COVID-19 are in a state of rapid change based on information released by regulatory bodies including the CDC and  federal and state organizations. These policies and algorithms were followed during the patient's care in the ED.     Patient presents with viral symptoms for 5-6 days. adequate oral hydration. Rest of history as above.  Patient appears well. No  signs of toxicity, patient is interactive. No hypoxia, tachypnea or other signs of respiratory distress. No sign of clinical dehydration. Lung exam clear. Rest of exam as above.  Chest x-ray negative for PNA. No leukocytosis or anemia. No significant electrolyte derangement. Given Pain and recent flights, dimer obtain.  Negative.  Doubt DVT.  Most consistent with viral illness   No evidence suggestive of pharyngitis, AOM, PNA, or meningitis.  Discussed symptomatic treatment with the patient and they will follow closely with their PCP.   Pertinent labs & imaging results that were available during my care of the patient were reviewed by me and considered in my medical decision making:    Final Clinical Impression(s) / ED Diagnoses Final diagnoses:  Acute cough   The patient appears reasonably screened and/or stabilized for discharge and I doubt any other medical condition or other [facility] requiring further screening, evaluation, or treatment in the ED at this time prior to discharge. Safe for discharge with strict return precautions.  Disposition: Discharge  Condition: Good  I have discussed the results, Dx and Tx plan with the patient/family who expressed understanding and agree(s) with the plan. Discharge instructions discussed at length. The patient/family was given strict return precautions who verbalized understanding of the instructions. No further questions at time of discharge.    ED Discharge Orders     None       Follow Up: Deeann Saint, MD [address] [place] [address] [phone]  Call  to schedule an appointment for close follow up     This chart was dictated using voice recognition software.   Despite best efforts to proofread,  errors can occur which can change the documentation meaning.    Nira Conn, MD 06/04/21 [phone]

## 2021-06-04 NOTE — Discharge Instructions (Signed)
You may take over-the-counter medicine for symptomatic relief, such as Tylenol, Motrin, TheraFlu, Alka seltzer , black elderberry, etc. Please limit acetaminophen (Tylenol) to 4000 mg and Ibuprofen (Motrin, Advil, etc.) to 2400 mg for a 24hr period. Please note that other over-the-counter medicine may contain acetaminophen or ibuprofen as a component of their ingredients.   

## 2021-06-04 NOTE — ED Triage Notes (Signed)
Patient flew to [place] on Sunday and flew back home today. Patient reports having cough since Sunday and leg pain starting today on the plane.

## 2022-04-16 ENCOUNTER — Emergency Department (HOSPITAL_BASED_OUTPATIENT_CLINIC_OR_DEPARTMENT_OTHER)
Admission: EM | Admit: 2022-04-16 | Discharge: 2022-04-16 | Disposition: A | Discharge status: Home / Self Care | Payer: 59 | Attending: Emergency Medicine | Admitting: Emergency Medicine

## 2022-04-16 ENCOUNTER — Other Ambulatory Visit: Payer: Self-pay

## 2022-04-16 ENCOUNTER — Encounter (HOSPITAL_BASED_OUTPATIENT_CLINIC_OR_DEPARTMENT_OTHER): Payer: Self-pay | Admitting: Emergency Medicine

## 2022-04-16 ENCOUNTER — Emergency Department (HOSPITAL_BASED_OUTPATIENT_CLINIC_OR_DEPARTMENT_OTHER): Payer: 59 | Admitting: Radiology

## 2022-04-16 DIAGNOSIS — R001 Bradycardia, unspecified: Secondary | ICD-10-CM | POA: Insufficient documentation

## 2022-04-16 DIAGNOSIS — R079 Chest pain, unspecified: Secondary | ICD-10-CM | POA: Insufficient documentation

## 2022-04-16 LAB — CBC
HCT: 40.8 % (ref 36.0–46.0)
Hemoglobin: 14 g/dL (ref 12.0–15.0)
MCH: 29 pg (ref 26.0–34.0)
MCHC: 34.3 g/dL (ref 30.0–36.0)
MCV: 84.5 fL (ref 80.0–100.0)
Platelets: 299 10*3/uL (ref 150–400)
RBC: 4.83 MIL/uL (ref 3.87–5.11)
RDW: 13.2 % (ref 11.5–15.5)
WBC: 5.5 10*3/uL (ref 4.0–10.5)
nRBC: 0 % (ref 0.0–0.2)

## 2022-04-16 LAB — BASIC METABOLIC PANEL
Anion gap: 8 (ref 5–15)
BUN: 11 mg/dL (ref 6–20)
CO2: 28 mmol/L (ref 22–32)
Calcium: 9.5 mg/dL (ref 8.9–10.3)
Chloride: 105 mmol/L (ref 98–111)
Creatinine, Ser: 0.97 mg/dL (ref 0.44–1.00)
GFR, Estimated: >60 mL/min (ref >60)
Glucose, Bld: 86 mg/dL (ref 70–99)
Potassium: 4 mmol/L (ref 3.5–5.1)
Sodium: 141 mmol/L (ref 135–145)

## 2022-04-16 LAB — TROPONIN I (HIGH SENSITIVITY)
Troponin I (High Sensitivity): <2 ng/L (ref <18)
Troponin I (High Sensitivity): <2 ng/L (ref <18)

## 2022-04-16 NOTE — ED Provider Notes (Signed)
[facility] EMERGENCY DEPT Provider Note   CSN: [id number] Arrival date & time: 04/16/22  1318     History  Chief Complaint  Patient presents with   Chest Pain    Lori Vaughn is a 36 y.o. female.   Chest Pain Patient presents with chest pain.  Left-sided.  Comes and goes.  It is on the left side of the chest.  States it comes on with stress.  Not exertional.  States she has been able to exert herself by exercising 3 times a week and does not get pain.  Not worse with breathing.  No trauma.  States she is under a lot of stress in her life.    Past Medical History:  Diagnosis Date   Amenorrhea 01/28/2017   Amenorrhea, secondary 01/28/2017   Bacterial vaginitis 01/28/2017   Elevated serum hCG 01/28/2017   HCG 26 on 01/28/17   Migraines    PCOS (polycystic ovarian syndrome)    PCOS (polycystic ovarian syndrome)     Home Medications Prior to Admission medications   Medication Sig Start Date End Date Taking? Authorizing Provider  albuterol (VENTOLIN HFA) 108 (90 Base) MCG/ACT inhaler Inhale 2 puffs into the lungs every 6 (six) hours as needed for wheezing or shortness of breath. 05/18/21   Theadora Rama Scales, PA-C  Multiple Vitamins-Minerals (MULTIVITAMIN ADULTS PO) Take by mouth.    [provider]      Allergies    Molds & smuts    Review of Systems   Review of Systems  Cardiovascular:  Positive for chest pain.    Physical Exam Updated Vital Signs BP 118/86   Pulse (!) 50   Temp (!) 97.5 F (36.4 C)   Resp 16   Ht 5\' 11"  (1.803 m)   Wt 90.3 kg   LMP 04/11/2022 (Approximate)   SpO2 100%   BMI 27.75 kg/m  Physical Exam Vitals and nursing note reviewed.  Cardiovascular:     Rate and Rhythm: Normal rate and regular rhythm.  Pulmonary:     Breath sounds: No wheezing, rhonchi or rales.  Chest:     Chest wall: No tenderness.  Musculoskeletal:     Cervical back: Neck supple.     Right lower leg: No edema.     Left lower  leg: No edema.  Skin:    General: Skin is warm.     Capillary Refill: Capillary refill takes less than 2 seconds.  Neurological:     Mental Status: She is alert and oriented to person, place, and time.     ED Results / Procedures / Treatments   Labs (all labs ordered are listed, but only abnormal results are displayed) Labs Reviewed  BASIC METABOLIC PANEL  CBC  PREGNANCY, URINE  TROPONIN I (HIGH SENSITIVITY)  TROPONIN I (HIGH SENSITIVITY)    EKG EKG Interpretation  Date/Time:  Friday April 16 2022 13:30:02 EDT Ventricular Rate:  53 PR Interval:  142 QRS Duration: 92 QT Interval:  448 QTC Calculation: 420 R Axis:   84 Text Interpretation: Sinus bradycardia Otherwise normal ECG When compared with ECG of 04-Jun-2021 04:06, No significant change since last tracing Confirmed by 06-Jun-2021 [phone]) on 04/16/2022 1:42:22 PM  Radiology DG Chest 2 View  Result Date: 04/16/2022 CLINICAL DATA:  Chest pain for 1 week. EXAM: CHEST - 2 VIEW COMPARISON:  Radiographs 06/04/2021 and 05/18/2021. FINDINGS: The heart size and mediastinal contours are normal. The lungs are clear. There is no pleural effusion or pneumothorax. No  acute osseous findings are identified. IMPRESSION: Stable chest.  No active cardiopulmonary process. Electronically Signed   By: Carey Bullocks M.D.   On: 04/16/2022 14:18    Procedures Procedures    Medications Ordered in ED Medications - No data to display  ED Course/ Medical Decision Making/ A&P                           Medical Decision Making Amount and/or Complexity of Data Reviewed Labs: ordered. Radiology: ordered.   Patient with left-sided chest tenderness.  Differential diagnosis includes musculoskeletal chest pain, pneumonia, pneumothorax, acute coronary syndrome.  Chest x-ray done and reassuring.  EKG also reassuring.  Mild bradycardia but think this is likely more due to her exercise 3 times a week.  Patient states she is worried because her  blood pressure was high however when she checked it it was 130/85. Troponin negative x2.  Doubt cardiac cause.  Well-appearing.  Likely a component of stress.  Doubt pulm embolism.  Doubt pneumonia.  Appears stable for discharge home with outpatient follow-up.        Final Clinical Impression(s) / ED Diagnoses Final diagnoses:  Nonspecific chest pain    Rx / DC Orders ED Discharge Orders     None         Benjiman Core, MD 04/16/22 1721

## 2022-04-16 NOTE — ED Notes (Signed)
ED Provider at bedside. 

## 2022-04-16 NOTE — ED Triage Notes (Signed)
Chest pain for a week , denies n/v/sob states she took her bp and it was high

## 2022-04-20 IMAGING — DX DG CHEST 2V
2 series · 2 of 2 positions shown · non-contrast
Comparison: 12/01/2020

CLINICAL DATA: Productive cough.  Blood tinged sputum.

EXAM:
CHEST - 2 VIEW

[chest pa]
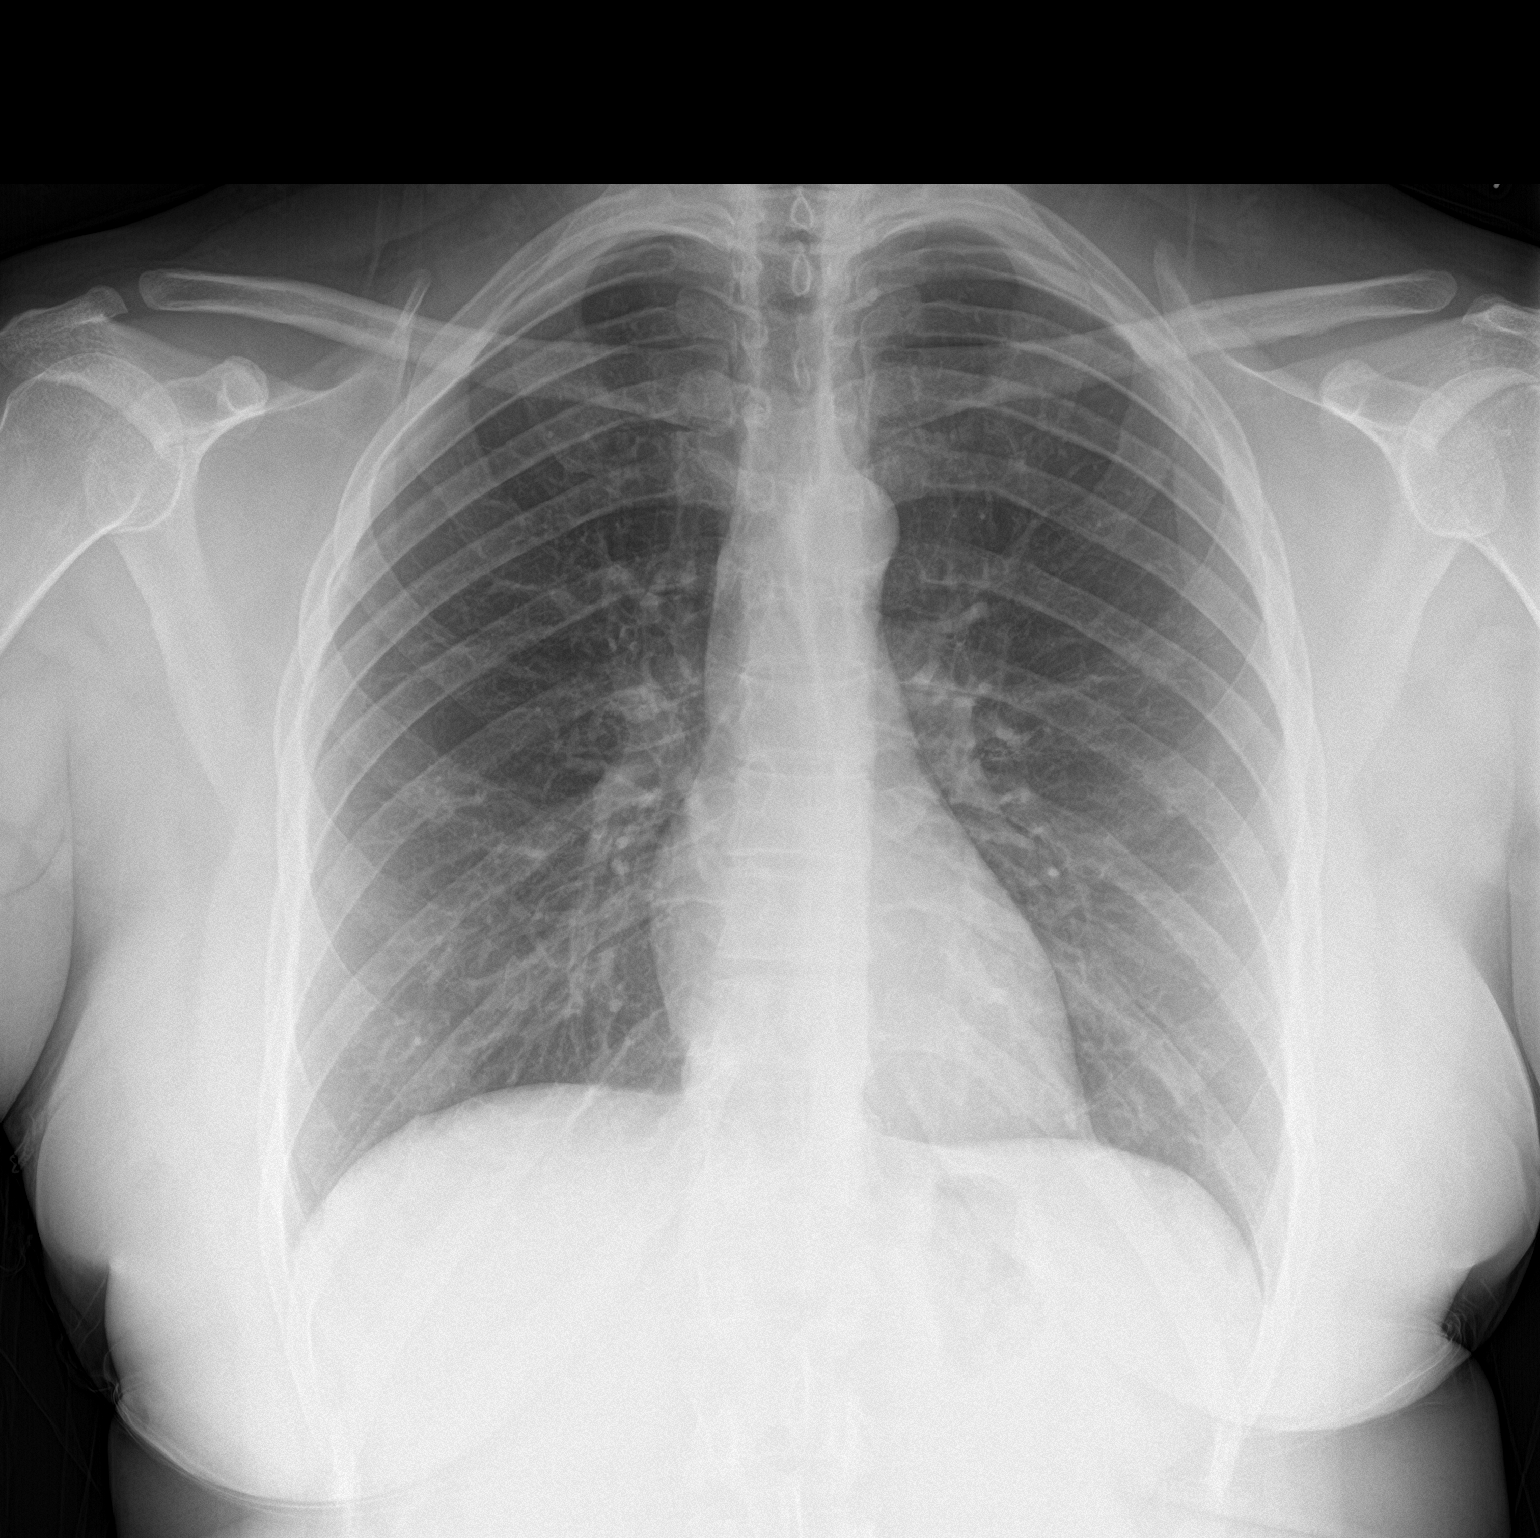

[chest lat]
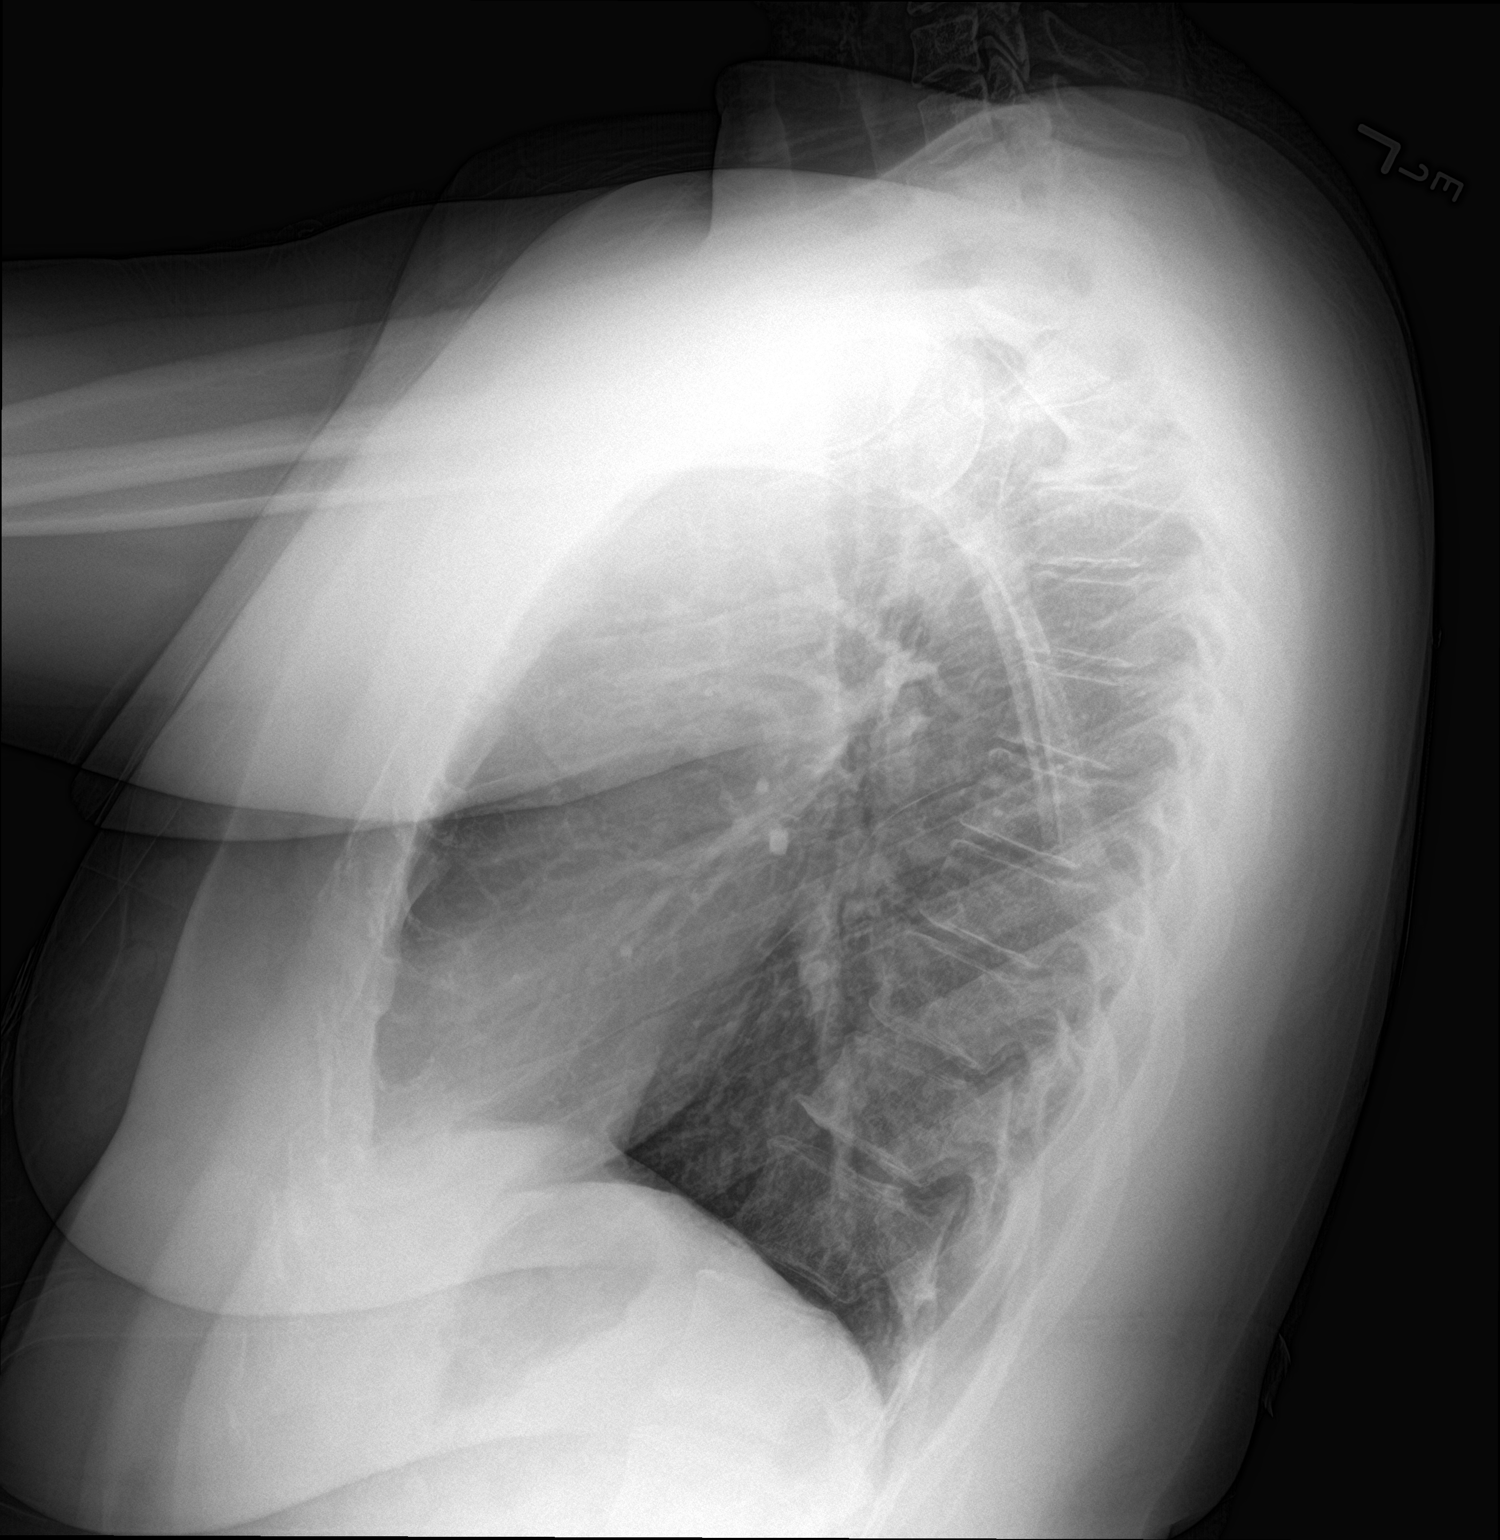

[2 of 2 positions shown; findings below may reference images not displayed]

FINDINGS: The heart size and mediastinal contours are within normal limits.
Both lungs are clear. The visualized skeletal structures are
unremarkable.
IMPRESSION: Normal exam.

## 2023-08-17 ENCOUNTER — Telehealth: Payer: Self-pay | Admitting: Family Medicine

## 2023-08-17 NOTE — Telephone Encounter (Signed)
 Last Visit:  11/26/2020 Called Pt to schedule a CPE.  LVM for Pt to call us back to schedule.

## 2023-10-05 ENCOUNTER — Ambulatory Visit (INDEPENDENT_AMBULATORY_CARE_PROVIDER_SITE_OTHER): Payer: 59 | Admitting: Family Medicine

## 2023-10-05 ENCOUNTER — Encounter: Payer: Self-pay | Admitting: Family Medicine

## 2023-10-05 VITALS — BP 120/74 | HR 67 | Temp 98.4°F | Ht 71.0 in | Wt 207.0 lb

## 2023-10-05 DIAGNOSIS — L659 Nonscarring hair loss, unspecified: Secondary | ICD-10-CM

## 2023-10-05 DIAGNOSIS — R600 Localized edema: Secondary | ICD-10-CM

## 2023-10-05 DIAGNOSIS — R21 Rash and other nonspecific skin eruption: Secondary | ICD-10-CM

## 2023-10-05 DIAGNOSIS — Z0001 Encounter for general adult medical examination with abnormal findings: Secondary | ICD-10-CM | POA: Diagnosis not present

## 2023-10-05 DIAGNOSIS — R5383 Other fatigue: Secondary | ICD-10-CM

## 2023-10-05 DIAGNOSIS — E282 Polycystic ovarian syndrome: Secondary | ICD-10-CM | POA: Diagnosis not present

## 2023-10-05 DIAGNOSIS — Z111 Encounter for screening for respiratory tuberculosis: Secondary | ICD-10-CM | POA: Diagnosis not present

## 2023-10-05 DIAGNOSIS — F439 Reaction to severe stress, unspecified: Secondary | ICD-10-CM | POA: Diagnosis not present

## 2023-10-05 NOTE — Progress Notes (Signed)
 Established Patient Office Visit   Subjective  Patient ID: Lori Vaughn, female    DOB: [birth year]  Age: 38 y.o. MRN: [id number]  Chief Complaint  Patient presents with   Annual Exam  Pt accompanied by her 36 yo daughter.  Patient is a 38 year old female lost to follow-up due to loss of insurance, last seen 2022, who presents for CPE.  Patient endorses numerous concerns.  Patient endorses hair loss.  Noticed times last few months.  Hair coming out with the root/bulb attached.  Patient noticed increased shedding after having hair and a protective style.  Patient denies overt areas of balding.  Patient mentions family history of lupus.  States had a rash on left lateral back, right lateral breast, and upper back several months ago that is since resolved.  Rash was pruritic and scaly in appearance.  Patient is also noticed facial edema.  Inquires about elevated cortisol levels due to increased stress and worsening of PCOS.  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  Needs TB testing.   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   History of migraine 08/29/2018   Amenorrhea 01/28/2017   Bacterial vaginitis 01/28/2017   Elevated serum hCG 01/28/2017   PCOS (polycystic ovarian syndrome) 01/30/2016   Past Medical History:  Diagnosis Date   Amenorrhea 01/28/2017   Amenorrhea, secondary 01/28/2017   Bacterial vaginitis 01/28/2017   Elevated serum hCG 01/28/2017   HCG 26 on 01/28/17   Migraines    PCOS (polycystic ovarian syndrome)    PCOS (polycystic ovarian syndrome)    Past Surgical History:  Procedure Laterality Date   NASAL SINUS SURGERY     Social History   Tobacco Use   Smoking status: Never   Smokeless tobacco: Never   Vaping Use   Vaping status: Never Used  Substance Use Topics   Alcohol use: Yes    Comment: Occasionally   Drug use: Yes    Types: Marijuana   Family History  Problem Relation Age of Onset   Diabetes Father    Hypertension Father    Cystic fibrosis Father    Sickle cell trait Father    Diabetes Maternal Grandmother    Diabetes Paternal Grandmother    Bipolar disorder Mother    Depression Mother    Healthy Sister    Allergies  Allergen Reactions   Molds & Smuts Other (See Comments)    Nasal polyps      ROS Negative unless stated above    Objective:     BP 120/74 (BP Location: Left Arm, Patient Position: Sitting, Cuff Size: Large)   Pulse 67   Temp 98.4 F (36.9 C) (Oral)   Ht 5\' 11"  (1.803 m)   Wt 207 lb (93.9 kg)   LMP 09/28/2023 (Exact Date)   SpO2 98%   BMI 28.87 kg/m  BP Readings from Last 3 Encounters:  10/05/23 120/74  04/16/22 118/86  06/04/21 123/82   Wt Readings from Last 3 Encounters:  10/05/23 207 lb (93.9 kg)  04/16/22 199 lb (90.3 kg)  06/04/21 236 lb (107 kg)      Physical Exam Constitutional:      Appearance: Normal appearance.  HENT:     Head: Normocephalic and atraumatic.  Right Ear: Tympanic membrane, ear canal and external ear normal.     Left Ear: Tympanic membrane, ear canal and external ear normal.     Nose: Nose normal.     Mouth/Throat:     Mouth: Mucous membranes are moist.     Pharynx: No oropharyngeal exudate or posterior oropharyngeal erythema.  Eyes:     General: No scleral icterus.    Extraocular Movements: Extraocular movements intact.     Conjunctiva/sclera: Conjunctivae normal.     Pupils: Pupils are equal, round, and reactive to light.  Neck:     Thyroid: No thyromegaly.  Cardiovascular:     Rate and Rhythm: Normal rate and regular rhythm.     Pulses: Normal pulses.     Heart sounds: Normal heart sounds. No murmur heard.    No friction rub.  Pulmonary:     Effort: Pulmonary effort is normal.      Breath sounds: Normal breath sounds. No wheezing, rhonchi or rales.  Abdominal:     General: Bowel sounds are normal.     Palpations: Abdomen is soft.     Tenderness: There is no abdominal tenderness.  Musculoskeletal:        General: No deformity. Normal range of motion.  Lymphadenopathy:     Cervical: No cervical adenopathy.  Skin:    General: Skin is warm and dry.     Findings: No lesion.     Comments: Birthmark with hyperpigmentation of skin of anterior neck, chin, and RUE.  Circular hypopigmented papules on right hand, right forearm.  Neurological:     General: No focal deficit present.     Mental Status: She is alert and oriented to person, place, and time.  Psychiatric:        Mood and Affect: Mood normal.        Thought Content: Thought content normal.      10/05/2023    4:09 PM  Depression screen PHQ 2/9  Decreased Interest 1  Down, Depressed, Hopeless 1  PHQ - 2 Score 2  Altered sleeping 2  Tired, decreased energy 3  Change in appetite 1  Feeling bad or failure about yourself  3  Trouble concentrating 1  Moving slowly or fidgety/restless 1  Suicidal thoughts 0  PHQ-9 Score 13  Difficult doing work/chores Very difficult      10/05/2023    4:09 PM  GAD 7 : Generalized Anxiety Score  Nervous, Anxious, on Edge 3  Control/stop worrying 2  Worry too much - different things 2  Trouble relaxing 3  Restless 1  Easily annoyed or irritable 3  Afraid - awful might happen 0  Total GAD 7 Score 14  Anxiety Difficulty Very difficult   No results found for any visits on 10/05/23.    Assessment & Plan:  Encounter for well adult exam with abnormal findings -     CBC with Differential/Platelet; Future -     Comprehensive metabolic panel; Future -     Hemoglobin A1c; Future -     Lipid panel; Future -     TSH; Future -     T4, free; Future  PPD screening test -     QuantiFERON-TB Gold Plus  Fatigue, unspecified type -     CBC with Differential/Platelet; Future -      Comprehensive metabolic panel; Future -     Hemoglobin A1c; Future -     TSH; Future -     Vitamin B12; Future -  VITAMIN D 25 Hydroxy (Vit-D Deficiency, Fractures); Future -     T4, free; Future -     C-reactive protein; Future -     Lupus (SLE) Analysis; Future  Hair loss -     CBC with Differential/Platelet; Future -     Comprehensive metabolic panel; Future -     TSH; Future -     T4, free; Future  Stress -     CBC with Differential/Platelet; Future  Facial edema -     CBC with Differential/Platelet; Future -     Comprehensive metabolic panel; Future -     TSH; Future -     T4, free; Future -     C-reactive protein; Future -     Lupus (SLE) Analysis; Future  PCOS (polycystic ovarian syndrome) -     Hemoglobin A1c; Future  Rash -     C-reactive protein; Future -     Lupus (SLE) Analysis; Future  Patient lost to follow-up due to loss of insurance.  Patient seen for CPE and several ongoing concerns.  Will obtain labs.  Immunizations reviewed.  Patient declines at this time.  Pap with OB/GYN.  Discussed possible causes of hair loss and facial edema including thyroid dysfunction, autoimmune etiology, traction alopecia.  Patient advised to avoid pulling or applying extra stress to hair.  Consider hair multivitamin.  Discussed ways to decrease stress.  PHQ 9 score 13, GAD 7 score 14.  Consider counseling. Also consider medication options. Self-care encouraged.  Prior rash now resolved however consider autoimmune disorder such as lupus or sarcoidosis due to family history.  Obtain screening labs.  Further recommendations based on results.  Patient encouraged to schedule follow-up to further review any concerns due to decreased visit time due to late arrival.  Return in about 4 weeks (around 11/02/2023), or if symptoms worsen or fail to improve.   Deeann Saint, MD

## 2023-10-06 ENCOUNTER — Encounter: Payer: Self-pay | Admitting: Family Medicine

## 2023-10-06 ENCOUNTER — Other Ambulatory Visit: Payer: Self-pay | Admitting: Family Medicine

## 2023-10-06 DIAGNOSIS — E559 Vitamin D deficiency, unspecified: Secondary | ICD-10-CM

## 2023-10-06 LAB — COMPREHENSIVE METABOLIC PANEL
ALT: 20 U/L (ref 0–35)
AST: 19 U/L (ref 0–37)
Albumin: 4.2 g/dL (ref 3.5–5.2)
Alkaline Phosphatase: 52 U/L (ref 39–117)
BUN: 12 mg/dL (ref 6–23)
CO2: 29 meq/L (ref 19–32)
Calcium: 9.3 mg/dL (ref 8.4–10.5)
Chloride: 102 meq/L (ref 96–112)
Creatinine, Ser: 1.07 mg/dL (ref 0.40–1.20)
GFR: 66.64 mL/min (ref >60.00)
Glucose, Bld: 81 mg/dL (ref 70–99)
Potassium: 4 meq/L (ref 3.5–5.1)
Sodium: 138 meq/L (ref 135–145)
Total Bilirubin: 0.8 mg/dL (ref 0.2–1.2)
Total Protein: 7.4 g/dL (ref 6.0–8.3)

## 2023-10-06 LAB — LIPID PANEL
Cholesterol: 160 mg/dL (ref 0–200)
HDL: 52.1 mg/dL (ref >39.00)
LDL Cholesterol: 92 mg/dL (ref 0–99)
NonHDL: 107.96
Total CHOL/HDL Ratio: 3
Triglycerides: 80 mg/dL (ref 0.0–149.0)
VLDL: 16 mg/dL (ref 0.0–40.0)

## 2023-10-06 LAB — CBC WITH DIFFERENTIAL/PLATELET
Basophils Absolute: 0 10*3/uL (ref 0.0–0.1)
Basophils Relative: 0.9 % (ref 0.0–3.0)
Eosinophils Absolute: 0.3 10*3/uL (ref 0.0–0.7)
Eosinophils Relative: 5.4 % — ABNORMAL HIGH (ref 0.0–5.0)
HCT: 38.6 % (ref 36.0–46.0)
Hemoglobin: 13.2 g/dL (ref 12.0–15.0)
Lymphocytes Relative: 42.5 % (ref 12.0–46.0)
Lymphs Abs: 2.2 10*3/uL (ref 0.7–4.0)
MCHC: 34.1 g/dL (ref 30.0–36.0)
MCV: 88.2 fL (ref 78.0–100.0)
Monocytes Absolute: 0.3 10*3/uL (ref 0.1–1.0)
Monocytes Relative: 5 % (ref 3.0–12.0)
Neutro Abs: 2.4 10*3/uL (ref 1.4–7.7)
Neutrophils Relative %: 46.2 % (ref 43.0–77.0)
Platelets: 240 10*3/uL (ref 150.0–400.0)
RBC: 4.38 Mil/uL (ref 3.87–5.11)
RDW: 13.7 % (ref 11.5–15.5)
WBC: 5.2 10*3/uL (ref 4.0–10.5)

## 2023-10-06 LAB — C-REACTIVE PROTEIN: CRP: <1 mg/dL (ref 0.5–20.0)

## 2023-10-06 LAB — T4, FREE: Free T4: 0.81 ng/dL (ref 0.60–1.60)

## 2023-10-06 LAB — VITAMIN B12: Vitamin B-12: 333 pg/mL (ref 211–911)

## 2023-10-06 LAB — VITAMIN D 25 HYDROXY (VIT D DEFICIENCY, FRACTURES): VITD: 25.99 ng/mL — ABNORMAL LOW (ref 30.00–100.00)

## 2023-10-06 LAB — TSH: TSH: 1.3 u[IU]/mL (ref 0.35–5.50)

## 2023-10-06 LAB — HEMOGLOBIN A1C: Hgb A1c MFr Bld: 5.6 % (ref 4.6–6.5)

## 2023-10-06 MED ORDER — VITAMIN D (ERGOCALCIFEROL) 1.25 MG (50000 UNIT) PO CAPS: 50000.0000 [IU] | ORAL_CAPSULE | ORAL | 0 refills | Status: AC

## 2023-10-07 ENCOUNTER — Telehealth: Payer: Self-pay | Admitting: Family Medicine

## 2023-10-07 ENCOUNTER — Other Ambulatory Visit: Payer: Self-pay

## 2023-10-07 DIAGNOSIS — E559 Vitamin D deficiency, unspecified: Secondary | ICD-10-CM

## 2023-10-07 LAB — LUPUS (SLE) ANALYSIS
Anti-striation Abs: NEGATIVE
Complement C4, Serum: 24 mg/dL (ref 12–38)
Mitochondrial Ab: <20 U (ref 0.0–20.0)
Parietal Cell Ab: 1.3 U (ref 0.0–20.0)
Smooth Muscle Ab: 4 U (ref 0–19)
Thyroperoxidase Ab SerPl-aCnc: 17 [IU]/mL (ref 0–34)

## 2023-10-07 NOTE — Telephone Encounter (Signed)
 Patient dropped off document  TB/Health Screen Form , to be filled out by provider. Patient requested to send it back via Call Patient to pick up within 5-days. Document is located in providers tray at front office.Please advise at Mobile [phone] (mobile)

## 2023-10-07 NOTE — Telephone Encounter (Signed)
 Copied from CRM [id number]. Topic: Medical Record Request - Other >> Oct 07, 2023  2:35 PM Chantha C wrote: Reason for CRM: Patient dropped off document TB/Health Screen Form today and is asking for it  it to be expedited so patient can attend the orientation for Wednesday 10/12/23. Informed patient it can take 5-10 days for forms to be filled out. Please call [phone] when forms is ready for pick up.

## 2023-10-08 LAB — QUANTIFERON-TB GOLD PLUS
Mitogen-NIL: 9.93 [IU]/mL
NIL: 0.04 [IU]/mL
QuantiFERON-TB Gold Plus: NEGATIVE
TB1-NIL: 0 [IU]/mL
TB2-NIL: 0 [IU]/mL

## 2023-10-11 NOTE — Telephone Encounter (Signed)
 Pt is aware md not in office today and that form can take up to 7 business days

## 2023-10-11 NOTE — Telephone Encounter (Signed)
 Pt calling back to check the status of the Health Screen form. I advised that she would be contacted once this is completed, but she stated that this needs to be completed today for her employer. Can this be expedited and completed asap? Please follow up

## 2023-10-12 DIAGNOSIS — Z0279 Encounter for issue of other medical certificate: Secondary | ICD-10-CM

## 2023-10-12 NOTE — Telephone Encounter (Signed)
 Form has been filled out patient is aware form is at the front desk for pick up

## 2023-12-22 ENCOUNTER — Other Ambulatory Visit: Payer: Self-pay | Admitting: Family Medicine

## 2023-12-22 DIAGNOSIS — E559 Vitamin D deficiency, unspecified: Secondary | ICD-10-CM

## 2024-01-09 ENCOUNTER — Other Ambulatory Visit (HOSPITAL_COMMUNITY)
Admission: RE | Admit: 2024-01-09 | Discharge: 2024-01-09 | Disposition: A | Discharge status: Home / Self Care | Source: Ambulatory Visit | Attending: Obstetrics and Gynecology | Admitting: Obstetrics and Gynecology

## 2024-01-09 ENCOUNTER — Encounter: Payer: Self-pay | Admitting: Obstetrics and Gynecology

## 2024-01-09 ENCOUNTER — Ambulatory Visit (INDEPENDENT_AMBULATORY_CARE_PROVIDER_SITE_OTHER): Admitting: Obstetrics and Gynecology

## 2024-01-09 VITALS — BP 108/72 | HR 59 | Ht 71.5 in | Wt 216.0 lb

## 2024-01-09 DIAGNOSIS — E282 Polycystic ovarian syndrome: Secondary | ICD-10-CM | POA: Diagnosis not present

## 2024-01-09 DIAGNOSIS — Z01419 Encounter for gynecological examination (general) (routine) without abnormal findings: Secondary | ICD-10-CM | POA: Diagnosis present

## 2024-01-09 DIAGNOSIS — F3289 Other specified depressive episodes: Secondary | ICD-10-CM

## 2024-01-09 NOTE — Progress Notes (Signed)
 Subjective:     Lori Vaughn is a 38 y.o. female P1 with LMP 01/05/24 and BMI 29 who is here for a comprehensive physical exam. The patient reports a monthly period lasting 4-5 days. She is not currently sexually active and desires another child with the father of her daughter. She is waiting to get married first. She denies pelvic pain or abnormal discharge. She denies issues with bowel movement. She reports urinary urgency related to her fluid intake which she is trying to restrict in the evening. She reports issues with depression without suicidal ideations and a lot of stressful factors related to the health of her mother (recently diagnosed with schizophrenia and dementia), financial stress and inability to lose weight. Patient was seen by a therapist and is requesting a new referral. Patient reports symptoms related to BV a few weeks ago and desires to be tested. Patient is without any other complaints  Past Medical History:  Diagnosis Date   Amenorrhea 01/28/2017   Amenorrhea, secondary 01/28/2017   Bacterial vaginitis 01/28/2017   Elevated serum hCG 01/28/2017   HCG 26 on 01/28/17   Migraines    PCOS (polycystic ovarian syndrome)    PCOS (polycystic ovarian syndrome)    Past Surgical History:  Procedure Laterality Date   NASAL SINUS SURGERY     Family History  Problem Relation Age of Onset   Diabetes Father    Hypertension Father    Cystic fibrosis Father    Sickle cell trait Father    Diabetes Maternal Grandmother    Diabetes Paternal Grandmother    Bipolar disorder Mother    Depression Mother    Healthy Sister     Social History   Socioeconomic History   Marital status: Single    Spouse name: Not on file   Number of children: Not on file   Years of education: Not on file   Highest education level: Not on file  Occupational History   Not on file  Tobacco Use   Smoking status: Never   Smokeless tobacco: Never  Vaping Use   Vaping status: Never Used   Substance and Sexual Activity   Alcohol use: Yes    Comment: Occasionally   Drug use: Yes    Types: Marijuana   Sexual activity: Yes    Birth control/protection: None  Other Topics Concern   Not on file  Social History Narrative   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   Highest level of education:  Will be completing doctorate in [employer]   She lives with boyfriend and his cousin.    Social Drivers of Corporate investment banker Strain: Not on file  Food Insecurity: Not on file  Transportation Needs: Not on file  Physical Activity: Not on file  Stress: Not on file  Social Connections: Not on file  Intimate Partner Violence: Not on file   Health Maintenance  Topic Date Due   DTaP/Tdap/Td (1 - Tdap) Never done   Cervical Cancer Screening (HPV/Pap Cotest)  Never done   COVID-19 Vaccine (3 - 2024-25 season) 04/10/2023   INFLUENZA VACCINE  03/09/2024   Hepatitis C Screening  Completed   HIV Screening  Completed   HPV VACCINES  Aged Out   Meningococcal B Vaccine  Aged Out       Review of Systems Pertinent items noted in HPI and remainder of comprehensive ROS otherwise negative.   Objective:  Blood pressure 108/72, pulse (!) 59, height  5' 11.5" (1.816 m), weight 216 lb (98 kg), last menstrual period 01/05/2024, currently breastfeeding.   GENERAL: Well-developed, well-nourished female in no acute distress.  HEENT: Normocephalic, atraumatic. Sclerae anicteric.  NECK: Supple. Normal thyroid.  LUNGS: Clear to auscultation bilaterally.  HEART: Regular rate and rhythm. BREASTS: Symmetric in size. No palpable masses or lymphadenopathy, skin changes, or nipple drainage. ABDOMEN: Soft, nontender, nondistended. No organomegaly. PELVIC: Normal external female genitalia. Vagina is pink and rugated.  Normal discharge. Normal appearing cervix. Uterus is normal in size. No adnexal mass or tenderness. Chaperone present during the pelvic exam EXTREMITIES:  No cyanosis, clubbing, or edema, 2+ distal pulses.     Assessment:    Healthy female exam.      Plan:    Pap smear collected Vaginal swab collected Patient declines STI screening Patient had health maintenance labs with PCP earlier this year Patient will be contacted with abnormal results Patient referred to behavioral health- patient declines antidepressant for now Patient referred to nutritionist to aid with weight loss See After Visit Summary for Counseling Recommendations

## 2024-01-09 NOTE — Progress Notes (Signed)
 Pt is new to office needing routine exam, last exam was 2021. Pt has hx of abn pap.

## 2024-01-10 ENCOUNTER — Ambulatory Visit: Payer: Self-pay | Admitting: Obstetrics and Gynecology

## 2024-01-10 LAB — CERVICOVAGINAL ANCILLARY ONLY
Bacterial Vaginitis (gardnerella): POSITIVE — AB
Candida Glabrata: NEGATIVE
Candida Vaginitis: NEGATIVE
Comment: NEGATIVE
Comment: NEGATIVE
Comment: NEGATIVE

## 2024-01-10 MED ORDER — NUVESSA 1.3 % VA GEL: 1.0000 | Freq: Every evening | VAGINAL | 0 refills | Status: AC

## 2024-01-11 ENCOUNTER — Other Ambulatory Visit: Payer: Self-pay | Admitting: Obstetrics and Gynecology

## 2024-01-11 LAB — CYTOLOGY - PAP
Adequacy: ABSENT
Comment: NEGATIVE
Diagnosis: NEGATIVE
High risk HPV: NEGATIVE

## 2024-01-12 ENCOUNTER — Other Ambulatory Visit: Payer: Self-pay

## 2024-01-12 MED ORDER — METRONIDAZOLE 0.75 % VA GEL: 1.0000 | Freq: Every day | VAGINAL | 1 refills | Status: AC

## 2024-02-16 NOTE — Progress Notes (Deleted)
 Medical Nutrition Therapy   Appointment Start time:  ***  Appointment End time:  ***  Primary concerns today: ***  Referral diagnosis: PCOS (polycystic ovarian syndrome)  Preferred learning style: *** (auditory, visual, hands on, no preference indicated) Learning readiness: *** (not ready, contemplating, ready, change in progress)   NUTRITION ASSESSMENT    Clinical Medical Hx: *** Medications: *** Labs: *** Notable Signs/Symptoms: ***  Lifestyle & Dietary Hx ***  Estimated daily fluid intake: *** oz Supplements: *** Sleep: *** Stress / self-care: *** Current average weekly physical activity: ***  24-Hr Dietary Recall First Meal: *** Snack: *** Second Meal: *** Snack: *** Third Meal: *** Snack: *** Beverages: ***  Estimated Energy Needs Calories: *** Carbohydrate: ***g Protein: ***g Fat: ***g   NUTRITION DIAGNOSIS  {CHL AMB NUTRITIONAL [phone]}   NUTRITION INTERVENTION  Nutrition education (E-1) on the following topics:  ***  Handouts Provided Include  ***  Learning Style & Readiness for Change Teaching method utilized: Visual & Auditory  Demonstrated degree of understanding via: Teach Back  Barriers to learning/adherence to lifestyle change: ***  Goals Established by Pt ***   MONITORING & EVALUATION Dietary intake, weekly physical activity, and *** in ***.  Next Steps  Patient is to ***.

## 2024-02-24 ENCOUNTER — Ambulatory Visit: Admitting: Dietician

## 2024-02-24 DIAGNOSIS — E282 Polycystic ovarian syndrome: Secondary | ICD-10-CM

## 2024-03-12 NOTE — Progress Notes (Deleted)
 Medical Nutrition Therapy   Appointment Start time:  ***  Appointment End time:  ***  Primary concerns today: ***  Referral diagnosis: PCOS (polycystic ovarian syndrome)  Preferred learning style: *** (auditory, visual, hands on, no preference indicated) Learning readiness: *** (not ready, contemplating, ready, change in progress)   NUTRITION ASSESSMENT    Clinical Medical Hx: *** Medications: *** Labs: *** Notable Signs/Symptoms: ***  Lifestyle & Dietary Hx ***  Estimated daily fluid intake: *** oz Supplements: *** Sleep: *** Stress / self-care: *** Current average weekly physical activity: ***  24-Hr Dietary Recall First Meal: *** Snack: *** Second Meal: *** Snack: *** Third Meal: *** Snack: *** Beverages: ***  Estimated Energy Needs Calories: *** Carbohydrate: ***g Protein: ***g Fat: ***g   NUTRITION DIAGNOSIS  {CHL AMB NUTRITIONAL [phone]}   NUTRITION INTERVENTION  Nutrition education (E-1) on the following topics:  ***  Handouts Provided Include  ***  Learning Style & Readiness for Change Teaching method utilized: Visual & Auditory  Demonstrated degree of understanding via: Teach Back  Barriers to learning/adherence to lifestyle change: ***  Goals Established by Pt ***   MONITORING & EVALUATION Dietary intake, weekly physical activity, and *** in ***.  Next Steps  Patient is to ***.

## 2024-03-16 ENCOUNTER — Ambulatory Visit: Admitting: Dietician

## 2024-03-16 DIAGNOSIS — E282 Polycystic ovarian syndrome: Secondary | ICD-10-CM

## 2024-04-24 ENCOUNTER — Ambulatory Visit: Admitting: Skilled Nursing Facility1

## 2024-06-20 ENCOUNTER — Ambulatory Visit: Admitting: Skilled Nursing Facility1

## 2024-08-31 ENCOUNTER — Ambulatory Visit: Payer: Self-pay

## 2024-08-31 NOTE — Telephone Encounter (Signed)
 Called patient and left a VM patient would be to be sch to see Dr.Banks or go to [facility]

## 2024-08-31 NOTE — Telephone Encounter (Signed)
 FYI Only or Action Required?: Action required by provider: Pt requesting that meds be called in for a vaginal yeast infection to:[facility]  Pharmacy:  [id number] - [place], [place] - [address]  [address] [place] [address]  Phone: [phone] Fax: [phone]  Hours: Not open 24 hours   Patient was last seen in primary care on 10/05/2023 by Mercer Clotilda SAUNDERS, MD.  Called Nurse Triage reporting Vaginal Discharge.  Symptoms began several weeks ago.  Interventions attempted: Nothing.  Symptoms are: gradually worsening.  Triage Disposition: See PCP When Office is Open (Within 3 Days)  Patient/caregiver understands and will follow disposition?: No, refuses disposition                           Reason for Triage:  Patient would like meds called in for yeast infection   Symptoms: swelling, discharge, discomfort, itching.   Pharmacy:  [id number] - [place], [place] - [address]  [address] [place]  [address]  Phone: [phone] Fax: [phone]  Hours: Not open 24 hours  Reason for Disposition  [1] Symptoms of a yeast infection (i.e., itchy, white discharge, not bad smelling) AND [2] not improved > 3 days following Care Advice  Answer Assessment - Initial Assessment Questions 1. DISCHARGE: Describe the discharge. (e.g., white, yellow, green, gray, foamy, cottage cheese-like)     White - cottage cheese consistency 2. ODOR: Is there a bad odor?     no 3. ONSET: When did the discharge begin?     A couple of weeks ago 4. RASH: Is there a rash in the genital area? If Yes, ask: Describe it. (e.g., redness, blisters, sores, bumps)     no 5. ABDOMEN PAIN: Are you having any abdomen pain? If Yes, ask: What does it feel like?  (e.g., crampy, dull, intermittent, constant)      no 6. ABDOMEN PAIN SEVERITY: If present, ask: How bad is it? (e.g., Scale 1-10; mild,  moderate, or severe)     no 7. CAUSE: What do you think is causing the discharge? Have you had the same problem before? What happened then?     Yeast 8. OTHER SYMPTOMS: Do you have any other symptoms? (e.g., fever, itching, urination pain, vaginal bleeding, vaginal foreign body)     Itching, swelling, discomfort  Protocols used: Vaginal Discharge-A-AH
